# Patient Record
Sex: Female | Born: 1935 | Race: White | Hispanic: No | Smoking: Never smoker
Health system: Southern US, Community
[De-identification: ages and names within clinical notes are randomized; demographics above are authoritative.]

## PROBLEM LIST (undated history)

## (undated) DIAGNOSIS — E039 Hypothyroidism, unspecified: Secondary | ICD-10-CM

## (undated) HISTORY — PX: TONSILLECTOMY: SHX5217

## (undated) HISTORY — PX: REPLACEMENT TOTAL KNEE BILATERAL: SUR1225

## (undated) HISTORY — PX: APPENDECTOMY: SHX54

## (undated) HISTORY — DX: Hypothyroidism, unspecified: E03.9

## (undated) HISTORY — PX: CHOLECYSTECTOMY: SHX55

## (undated) HISTORY — PX: ABDOMINAL HYSTERECTOMY: SHX81

---

## 1999-02-14 ENCOUNTER — Encounter: Admission: RE | Admit: 1999-02-14 | Discharge: 1999-02-14 | Payer: Self-pay | Admitting: Obstetrics and Gynecology

## 1999-02-14 ENCOUNTER — Encounter: Payer: Self-pay | Admitting: Obstetrics and Gynecology

## 2000-02-16 ENCOUNTER — Encounter: Payer: Self-pay | Admitting: Obstetrics and Gynecology

## 2000-02-16 ENCOUNTER — Encounter: Admission: RE | Admit: 2000-02-16 | Discharge: 2000-02-16 | Payer: Self-pay | Admitting: Obstetrics and Gynecology

## 2001-02-18 ENCOUNTER — Encounter: Payer: Self-pay | Admitting: Obstetrics and Gynecology

## 2001-02-18 ENCOUNTER — Encounter: Admission: RE | Admit: 2001-02-18 | Discharge: 2001-02-18 | Payer: Self-pay | Admitting: Obstetrics and Gynecology

## 2002-03-31 ENCOUNTER — Encounter: Payer: Self-pay | Admitting: Obstetrics and Gynecology

## 2002-03-31 ENCOUNTER — Encounter: Admission: RE | Admit: 2002-03-31 | Discharge: 2002-03-31 | Payer: Self-pay | Admitting: Obstetrics and Gynecology

## 2003-04-20 ENCOUNTER — Encounter: Admission: RE | Admit: 2003-04-20 | Discharge: 2003-04-20 | Payer: Self-pay | Admitting: Obstetrics and Gynecology

## 2004-05-30 ENCOUNTER — Encounter: Admission: RE | Admit: 2004-05-30 | Discharge: 2004-05-30 | Payer: Self-pay | Admitting: Obstetrics and Gynecology

## 2005-07-10 ENCOUNTER — Encounter: Admission: RE | Admit: 2005-07-10 | Discharge: 2005-07-10 | Payer: Self-pay | Admitting: Obstetrics and Gynecology

## 2006-08-20 ENCOUNTER — Encounter: Admission: RE | Admit: 2006-08-20 | Discharge: 2006-08-20 | Payer: Self-pay | Admitting: Obstetrics and Gynecology

## 2008-08-14 ENCOUNTER — Inpatient Hospital Stay (HOSPITAL_COMMUNITY): Admission: RE | Admit: 2008-08-14 | Discharge: 2008-08-17 | Payer: Self-pay | Admitting: Orthopedic Surgery

## 2010-03-15 ENCOUNTER — Inpatient Hospital Stay (HOSPITAL_COMMUNITY)
Admission: RE | Admit: 2010-03-15 | Discharge: 2010-03-17 | Payer: Self-pay | Source: Home / Self Care | Attending: Orthopedic Surgery | Admitting: Orthopedic Surgery

## 2010-05-30 LAB — BASIC METABOLIC PANEL
BUN: 5 mg/dL — ABNORMAL LOW (ref 6–23)
Calcium: 9.6 mg/dL (ref 8.4–10.5)
Chloride: 106 mEq/L (ref 96–112)
Chloride: 107 mEq/L (ref 96–112)
Chloride: 107 mEq/L (ref 96–112)
Creatinine, Ser: 0.64 mg/dL (ref 0.4–1.2)
Creatinine, Ser: 0.81 mg/dL (ref 0.4–1.2)
GFR calc non Af Amer: >60 mL/min (ref >60)
Glucose, Bld: 110 mg/dL — ABNORMAL HIGH (ref 70–99)
Glucose, Bld: 126 mg/dL — ABNORMAL HIGH (ref 70–99)
Potassium: 4 mEq/L (ref 3.5–5.1)
Potassium: 4.3 mEq/L (ref 3.5–5.1)
Sodium: 140 mEq/L (ref 135–145)
Sodium: 143 mEq/L (ref 135–145)

## 2010-05-30 LAB — CBC
Hemoglobin: 10.1 g/dL — ABNORMAL LOW (ref 12.0–15.0)
Hemoglobin: 11.2 g/dL — ABNORMAL LOW (ref 12.0–15.0)
Hemoglobin: 14.5 g/dL (ref 12.0–15.0)
MCH: 30.1 pg (ref 26.0–34.0)
MCH: 31.1 pg (ref 26.0–34.0)
MCHC: 33.4 g/dL (ref 30.0–36.0)
MCHC: 33.7 g/dL (ref 30.0–36.0)
MCV: 92.3 fL (ref 78.0–100.0)
Platelets: 187 10*3/uL (ref 150–400)
RDW: 13.2 % (ref 11.5–15.5)
WBC: 7.3 10*3/uL (ref 4.0–10.5)

## 2010-05-30 LAB — URINALYSIS, ROUTINE W REFLEX MICROSCOPIC
Ketones, ur: NEGATIVE mg/dL
Nitrite: NEGATIVE
Protein, ur: NEGATIVE mg/dL
Specific Gravity, Urine: 1.023 (ref 1.005–1.030)
Urobilinogen, UA: 0.2 mg/dL (ref 0.0–1.0)

## 2010-05-30 LAB — TYPE AND SCREEN
ABO/RH(D): A POS
Antibody Screen: NEGATIVE

## 2010-05-30 LAB — SURGICAL PCR SCREEN: Staphylococcus aureus: NEGATIVE

## 2010-05-30 LAB — DIFFERENTIAL
Basophils Absolute: 0.1 10*3/uL (ref 0.0–0.1)
Eosinophils Relative: 3 % (ref 0–5)
Neutro Abs: 3.9 10*3/uL (ref 1.7–7.7)

## 2010-05-30 LAB — APTT: aPTT: 29 seconds (ref 24–37)

## 2010-05-30 LAB — PROTIME-INR: Prothrombin Time: 13 seconds (ref 11.6–15.2)

## 2010-06-09 NOTE — Discharge Summary (Signed)
NAMEKrislyn, [name]                  ACCOUNT NO.:  [id number]  MEDICAL RECORD NO.:  [id number]          PATIENT TYPE:  INP  LOCATION:  1612                         FACILITY:  [facility]  PHYSICIAN:  Madlyn Frankel. Charlann Boxer, M.D.  DATE OF BIRTH:  Dec 02, 1934  DATE OF ADMISSION:  03/15/2010 DATE OF DISCHARGE:  03/17/2010                              DISCHARGE SUMMARY   ADMITTING DIAGNOSIS:  Right knee osteoarthritis.  DISCHARGE DIAGNOSES: 1. Right knee osteoarthritis. 2. Hypothyroidism and hypercholesterolemia.  ADMITTING HISTORY:  [name] is a pleasant 76 year old female seen and evaluated in the office for some time with right knee osteoarthritis. At this point, we are failing to manage her knee pain conservatively and she was ready to proceed with knee replacement surgery.  Risks and benefits were discussed and surgery set up with postoperative course and plan.  HOSPITAL COURSE:  The patient was admitted for same-day surgery on March 15, 2010.  She underwent right total knee replacement that was uncomplicated.  Please see dictated operative note for full details of the procedure.  Postoperatively, she was transferred from the recovery room to the hospital floor where she remained for her 2-day hospital stay.  She was seen and evaluated by Physical Therapy.  She had her Foley removed and Hemovac removed.  Her hematocrit was 33.5 on postop day #1 with stable electrolytes.  She was seen and evaluated by Physical Therapy, weightbearing as tolerated, work on range of motion.  She was doing well enough on postop day #1 and she was feeling about going home but decided to go by day two.  She was on a regular diet.  DISCHARGE INSTRUCTIONS:  The patient was discharged to home on postop day #2 without any complicating features.  She will keep her wound dry until followup and will see Dr. Durene Todd at [facility] in 2 weeks for followup.  Any questions can be addressed at the  office. She will be on a regular diet.  DISCHARGE MEDICATIONS:  Her discharge medications at this point will include: 1. Colace 100 mg p.o. b.i.d. for constipation while on pain medicine. 2. Iron 325 mg p.o. 2-3 times a day for 2 weeks for perioperative     anemia. 3. Robaxin 500 mg p.o. q.6 hours for muscle spasm pain. 4. MiraLax 17 g p.o. daily for constipation while on pain medicines. 5. Xarelto 10 mg p.o. daily for 10 days followed by use of aspirin 325     mg daily for 4 weeks and then a regular 81 mg coated aspirin.  She had allergies to [facility] and OXYCODONE as well as DARVOCET and TRAMADOL.  For that reason, I only used Tylenol for pain control postop. She also used omeprazole 20 mg daily as needed for reflux, also Synthroid 100 mcg p.o. q.a.m., vitamin D 50,000 units monthly and Welchol 625 mg at bedtime  Questions were encouraged and reviewed at the time of discharge.     Madlyn Frankel Charlann Boxer, M.D.     MDO/MEDQ  D:  06/08/2010  T:  06/09/2010  Job:  [id number]  Electronically Signed by [name] M.D. on  06/09/2010 01:30:41 PM

## 2010-06-28 LAB — CBC
HCT: 28.2 % — ABNORMAL LOW (ref 36.0–46.0)
HCT: 32.5 % — ABNORMAL LOW (ref 36.0–46.0)
HCT: 44.6 % (ref 36.0–46.0)
Hemoglobin: 10.8 g/dL — ABNORMAL LOW (ref 12.0–15.0)
Hemoglobin: 15.4 g/dL — ABNORMAL HIGH (ref 12.0–15.0)
MCHC: 32.1 g/dL (ref 30.0–36.0)
MCHC: 33.4 g/dL (ref 30.0–36.0)
MCHC: 34.6 g/dL (ref 30.0–36.0)
MCV: 93.1 fL (ref 78.0–100.0)
Platelets: 169 10*3/uL (ref 150–400)
Platelets: 173 10*3/uL (ref 150–400)
Platelets: 188 10*3/uL (ref 150–400)
RBC: 3.28 MIL/uL — ABNORMAL LOW (ref 3.87–5.11)
RDW: 13.4 % (ref 11.5–15.5)
RDW: 13.4 % (ref 11.5–15.5)
RDW: 13.8 % (ref 11.5–15.5)
WBC: 6.3 10*3/uL (ref 4.0–10.5)
WBC: 6.5 10*3/uL (ref 4.0–10.5)

## 2010-06-28 LAB — BASIC METABOLIC PANEL
BUN: 10 mg/dL (ref 6–23)
BUN: 5 mg/dL — ABNORMAL LOW (ref 6–23)
BUN: 5 mg/dL — ABNORMAL LOW (ref 6–23)
CO2: 26 mEq/L (ref 19–32)
Calcium: 8.4 mg/dL (ref 8.4–10.5)
Calcium: 8.7 mg/dL (ref 8.4–10.5)
Calcium: 8.7 mg/dL (ref 8.4–10.5)
Calcium: 9.8 mg/dL (ref 8.4–10.5)
Chloride: 103 mEq/L (ref 96–112)
Chloride: 104 mEq/L (ref 96–112)
Creatinine, Ser: 0.6 mg/dL (ref 0.4–1.2)
Creatinine, Ser: 0.63 mg/dL (ref 0.4–1.2)
Creatinine, Ser: 0.68 mg/dL (ref 0.4–1.2)
Creatinine, Ser: 0.71 mg/dL (ref 0.4–1.2)
GFR calc Af Amer: >60 mL/min (ref >60)
GFR calc Af Amer: >60 mL/min (ref >60)
GFR calc Af Amer: >60 mL/min (ref >60)
GFR calc non Af Amer: >60 mL/min (ref >60)
GFR calc non Af Amer: >60 mL/min (ref >60)
Glucose, Bld: 103 mg/dL — ABNORMAL HIGH (ref 70–99)
Glucose, Bld: 122 mg/dL — ABNORMAL HIGH (ref 70–99)
Glucose, Bld: 97 mg/dL (ref 70–99)
Potassium: 4.6 mEq/L (ref 3.5–5.1)

## 2010-06-28 LAB — APTT
aPTT: 25 seconds (ref 24–37)
aPTT: 54 seconds — ABNORMAL HIGH (ref 24–37)

## 2010-06-28 LAB — PROTIME-INR
INR: 0.9 (ref 0.00–1.49)
INR: 1.4 (ref 0.00–1.49)
INR: 1.9 — ABNORMAL HIGH (ref 0.00–1.49)
Prothrombin Time: 17.4 seconds — ABNORMAL HIGH (ref 11.6–15.2)
Prothrombin Time: 17.5 seconds — ABNORMAL HIGH (ref 11.6–15.2)
Prothrombin Time: 22.9 seconds — ABNORMAL HIGH (ref 11.6–15.2)

## 2010-06-28 LAB — DIFFERENTIAL
Basophils Absolute: 0.1 10*3/uL (ref 0.0–0.1)
Eosinophils Relative: 3 % (ref 0–5)
Neutro Abs: 3.5 10*3/uL (ref 1.7–7.7)
Neutrophils Relative %: 56 % (ref 43–77)

## 2010-06-28 LAB — URINALYSIS, ROUTINE W REFLEX MICROSCOPIC
Bilirubin Urine: NEGATIVE
Nitrite: NEGATIVE
Protein, ur: NEGATIVE mg/dL
Urobilinogen, UA: 0.2 mg/dL (ref 0.0–1.0)

## 2010-06-28 LAB — TYPE AND SCREEN

## 2010-08-02 NOTE — Op Note (Signed)
NAMEManisha, Cancel Jimia                  ACCOUNT NO.:  [id number]   MEDICAL RECORD NO.:  [id number]          PATIENT TYPE:  INP   LOCATION:  0001                         FACILITY:  [facility]   PHYSICIAN:  Almedia Balls. Ranell Patrick, M.D. DATE OF BIRTH:  [birth year]   DATE OF PROCEDURE:  08/14/2008  DATE OF DISCHARGE:  08/17/2008                               OPERATIVE REPORT   PREOPERATIVE DIAGNOSIS:  Left knee end-stage osteoarthritis.   POSTOPERATIVE DIAGNOSIS:  Left knee end-stage osteoarthritis.   PROCEDURE PERFORMED:  Left total knee replacement using DePuy segmental  rotating platform prosthesis.   ATTENDING SURGEON:  Almedia Balls. Ranell Patrick, M.D.   ASSISTANT:  Donnie Coffin. Durwin Nora, P.A.   ANESTHESIA:  Spinal block anesthesia was used.   BLOOD LOSS:  Minimal.   TOURNIQUET TIME:  1 hour and 30 minutes at 300 mmHg.   INSTRUMENT COUNT:  Correct.   COMPLICATIONS:  None.   PERIOPERATIVE ANTIBIOTICS:  Given.   INDICATIONS:  The patient is a 76 year old female with a history of  worsening left knee pain secondary to end-stage arthritis.  The patient  is now desiring total knee replacement to restore function and eliminate  pain.  Informed consent was obtained.   DESCRIPTION OF PROCEDURE:  After an adequate level of local anesthesia  achieved the patient was positioned supine on the operating room table.  The left leg correctly identified.  The nonsterile tourniquet placed on  the proximal thigh.  The left leg sterilely prepped and draped in the  usual manner.  After elevation and exsanguination of the limb using the  Esmarch bandage we elevated the tourniquet to 300 mmHg.  A midline  incision was created with the knee in flexion.  Dissection down through  subcutaneous tissues.  A  median parapatellar arthrotomy was done.  The  patella was everted.  There was end-stage arthritis noted with bone on  bone wear, primarily in the medial compartment.  At this point, the  lateral patellofemoral ligaments  divided.  The distal femur entered  using a step-cut drill.  The 10 mm resected off the femur set on 5  degrees, left.  We then went ahead and sized the femur to a size 3,  anterior down and performed our four-in-one cut with the four-in-one  block.  The anterior, posterior and chamfer cuts were made.  The ACL,  PCL and meniscal tissue removed.  The tibia was subluxed anteriorly and  the tibia was cut using an external alignment jig, 4 mm off the affected  medial side and set on the minimal posterior slope, 90 degrees  perpendicular to the long axis of the tibia.  Once the tibia cut was  done we checked our flexion and extension gaps.  We were a little tight  on the medial side and performed a medial release, a single soft tissue  subperiosteal release.  This gave [place] the soft tissue balancing we  wanted.  We then resected extra posterior bone off the posterior femoral  condyles using a lamina spreader and a 3/4-inch curved osteotome.  Next,  we went ahead and  sized the tibia to size 3 and then performed a modular  drill and keel punch placing our tibial trial in place.  We then went  ahead and resected our box cut, using our box cut guide for the size 3  femur.  We then placed our trial 3 femur on the femur and reduced the  knee with a 10 mm insert and that seemed to give [place] good soft tissue  balancing and alignment.  We then went ahead and checked our patella.  It was 22 mm thick.  We resected down to 14 mm of thickness and then  placed a 32 patellar button on.  We had excellent patellar tracking  throughout a full range of motion with the no-hands technique.  We went  ahead and removed all trial components, thoroughly pulse irrigated the  knee and then plugged into the femur with available bone.  We then  cemented the components into place with DePuy SmartSet cement.  Once the  cement was allowed to harden we went and removed excess cement using 1/4-  inch curved osteotomes.  We began  trial with a 10 and the 12.5.  The  12.5 gave [place] a little bit better flexion and stability and was still  unable to get full extension.  We went ahead and selected a size 12.5  insert.  Replaced the trial insert with a real insert, reduced the knee.  Again, trialed through a full range of motion, good soft tissue  balancing, full extension and excellent patellar tracking.  Thorough  pulse irrigation, followed by closure of the parapatellar arthrotomy  using a #1 Vicryl suture figure-of-eight interrupted, followed by 2-0  Vicryl subcutaneous closure and 4-0 Monocryl for skin.  Steri-Strips  applied, followed by a sterile compressive bandage.  The patient  tolerated the surgery well.      Almedia Balls. Ranell Patrick, M.D.  Electronically Signed     SRN/MEDQ  D:  08/14/2008  T:  08/14/2008  Job:  [id number]

## 2010-08-05 NOTE — H&P (Signed)
NAMEAbimbola, [name]                  ACCOUNT NO.:  [id number]   MEDICAL RECORD NO.:  [id number]          PATIENT TYPE:  INP   LOCATION:  NA                           FACILITY:  [facility]   PHYSICIAN:  Almedia Balls. Ranell Patrick, M.D. DATE OF BIRTH:  April 07, 1934   DATE OF ADMISSION:  DATE OF DISCHARGE:                              HISTORY & PHYSICAL   CHIEF COMPLAINT:  Left knee pain.   HISTORY OF PRESENT ILLNESS:  The patient is a 76 year old female with  worsening left knee pain secondary to osteoarthritis.  The patient has  elected to have a left total knee arthroplasty by Dr. Malon Kindle.   PAST MEDICAL HISTORY:  1. Hyperlipidemia.  2. Hiatal hernia.  3. Hypothyroidism.   FAMILY MEDIAL HISTORY:  1. Coronary artery disease.  2. Hypertension.   SOCIAL HISTORY:  A patient of Dr. Marina Goodell.  Does not smoke or use alcohol  or illicit drugs.   ALLERGIES:  1. VICODIN.  2. CODEINE.  3. PERCOCET.  4. DARVOCET.   CURRENT MEDICATIONS:  1. Synthroid 112 mcg daily.  2. Claritin 10 mg daily.  3. WelChol 1 tablet daily.   REVIEW OF SYSTEMS:  Shows pain with range of motion and ambulation of  the left knee.   PHYSICAL EXAMINATION:  Pulse 80, respirations 16, blood pressure 140/68.  The patient is a healthy-appearing 76 year old female in no acute  distress, pleasant mood and affect, alert and oriented x3.  HEAD/NECK:  Shows cranial nerves II-XII are intact.  She has full range  of motion of the cervical spine without any tenderness.  No active  muscle spasm.  CHEST:  Active breath sounds bilaterally with no wheezes, rhonchi or  rales.  HEART:  Shows regular rate and regular rate and rhythm with no murmur.  ABDOMEN:  Nontender, nondistended with active bowel sounds.  EXTREMITIES:  Moderate tenderness and crepitus of the left knee,  especially the medial joint line.  NEUROVASCULAR:  She is intact distally with no edema or rashes.   X-rays show end-stage osteoarthritis of the left knee.   IMPRESSION:  End-stage osteoarthritis left knee.   PLAN OF ACTION:  She will have a left total knee arthroplasty by Dr.  Malon Kindle.      Thomas B. Durwin Nora, P.A.      Almedia Balls. Ranell Patrick, M.D.  Electronically Signed    TBD/MEDQ  D:  07/30/2008  T:  07/30/2008  Job:  [id number]

## 2010-08-05 NOTE — Discharge Summary (Signed)
[name], Kingsford Todd                  ACCOUNT NO.:  [id number]   MEDICAL RECORD NO.:  [id number]          PATIENT TYPE:  INP   LOCATION:  1533                         [facility]ACILITY:  [facility]   PHYSICIAN:  Almedia Balls. Ranell Patrick, M.D. DATE OF BIRTH:  1934-10-26   DATE OF ADMISSION:  08/14/2008  DATE OF DISCHARGE:  08/17/2008                               DISCHARGE SUMMARY   BRIEF HISTORY:  The patient is a 76 year old female with worsening left  knee pain secondary to osteoarthritis.  The patient has elected for  surgical management of pain relief.   PROCEDURES:  The patient had a left total knee arthroplasty by Dr.  Malon Kindle on Aug 14 998.  Assistant was [employer].  No  complications in hospital.  Minimal blood loss.   HOSPITAL COURSE:  The patient was admitted on Aug 14, 2008 with the  above-stated procedure which she tolerated well.  After adequate time in  post anesthesia care unit, she was transferred up to [place].  Postop  day #1, the patient complained about some minimal pain to that left  knee.  Was able to work with physical therapy over the next several  days.  Her labs were within stable limits.  She was afebrile.  After  several days of physical therapy she was discharged home with home  health physical therapy, home health nursing with no signs of  cellulitis, erythema or infection to her wound.   DISCHARGE PLAN:  The patient will be discharged home on Aug 17, 2008.   CONDITION:  Her condition is stable.   DIET:  Her diet is regular.   The patient has allergies to VICODIN, CODEINE, PERCOCET, and DARVOCET.   DISCHARGE MEDICATIONS:  1. Synthroid 112 mcg daily.  2. Claritin 10 mg daily.  3. Welchol 1 tablet daily.  4. Robaxin 500 mg p.o. q.6 hours p.r.n. spasm.  5. Ultram 50 mg 1-2 tabs q.4-6 hours p.r.n. pain.  6. Coumadin per pharmacy protocol.   FOLLOW UP:  The patient will follow back up with Dr. Malon Kindle, and  she is weightbearing as  tolerated.      Thomas B. Durwin Nora, P.A.      Almedia Balls. Ranell Patrick, M.D.  Electronically Signed    TBD/MEDQ  D:  09/10/2008  T:  09/10/2008  Job:  [id number]

## 2011-05-17 DIAGNOSIS — E785 Hyperlipidemia, unspecified: Secondary | ICD-10-CM | POA: Diagnosis not present

## 2011-05-17 DIAGNOSIS — D508 Other iron deficiency anemias: Secondary | ICD-10-CM | POA: Diagnosis not present

## 2011-05-17 DIAGNOSIS — E039 Hypothyroidism, unspecified: Secondary | ICD-10-CM | POA: Diagnosis not present

## 2011-05-17 DIAGNOSIS — E559 Vitamin D deficiency, unspecified: Secondary | ICD-10-CM | POA: Diagnosis not present

## 2011-05-17 DIAGNOSIS — Z0000 Encounter for general adult medical examination without abnormal findings: Secondary | ICD-10-CM | POA: Diagnosis not present

## 2011-07-13 DIAGNOSIS — Z1231 Encounter for screening mammogram for malignant neoplasm of breast: Secondary | ICD-10-CM | POA: Diagnosis not present

## 2011-08-23 DIAGNOSIS — E785 Hyperlipidemia, unspecified: Secondary | ICD-10-CM | POA: Diagnosis not present

## 2011-08-23 DIAGNOSIS — E559 Vitamin D deficiency, unspecified: Secondary | ICD-10-CM | POA: Diagnosis not present

## 2011-08-23 DIAGNOSIS — E039 Hypothyroidism, unspecified: Secondary | ICD-10-CM | POA: Diagnosis not present

## 2011-08-23 DIAGNOSIS — J309 Allergic rhinitis, unspecified: Secondary | ICD-10-CM | POA: Diagnosis not present

## 2011-08-23 DIAGNOSIS — D509 Iron deficiency anemia, unspecified: Secondary | ICD-10-CM | POA: Diagnosis not present

## 2011-11-30 DIAGNOSIS — T887XXA Unspecified adverse effect of drug or medicament, initial encounter: Secondary | ICD-10-CM | POA: Diagnosis not present

## 2011-11-30 DIAGNOSIS — I10 Essential (primary) hypertension: Secondary | ICD-10-CM | POA: Diagnosis not present

## 2011-11-30 DIAGNOSIS — E782 Mixed hyperlipidemia: Secondary | ICD-10-CM | POA: Diagnosis not present

## 2011-11-30 DIAGNOSIS — M25569 Pain in unspecified knee: Secondary | ICD-10-CM | POA: Diagnosis not present

## 2011-11-30 DIAGNOSIS — Z79899 Other long term (current) drug therapy: Secondary | ICD-10-CM | POA: Diagnosis not present

## 2011-11-30 DIAGNOSIS — E785 Hyperlipidemia, unspecified: Secondary | ICD-10-CM | POA: Diagnosis not present

## 2011-11-30 DIAGNOSIS — E559 Vitamin D deficiency, unspecified: Secondary | ICD-10-CM | POA: Diagnosis not present

## 2011-12-13 DIAGNOSIS — S8000XA Contusion of unspecified knee, initial encounter: Secondary | ICD-10-CM | POA: Diagnosis not present

## 2011-12-13 DIAGNOSIS — R262 Difficulty in walking, not elsewhere classified: Secondary | ICD-10-CM | POA: Diagnosis not present

## 2011-12-15 DIAGNOSIS — R262 Difficulty in walking, not elsewhere classified: Secondary | ICD-10-CM | POA: Diagnosis not present

## 2011-12-15 DIAGNOSIS — S8000XA Contusion of unspecified knee, initial encounter: Secondary | ICD-10-CM | POA: Diagnosis not present

## 2011-12-18 DIAGNOSIS — S8000XA Contusion of unspecified knee, initial encounter: Secondary | ICD-10-CM | POA: Diagnosis not present

## 2011-12-18 DIAGNOSIS — R262 Difficulty in walking, not elsewhere classified: Secondary | ICD-10-CM | POA: Diagnosis not present

## 2011-12-20 DIAGNOSIS — R82998 Other abnormal findings in urine: Secondary | ICD-10-CM | POA: Diagnosis not present

## 2011-12-20 DIAGNOSIS — S8000XA Contusion of unspecified knee, initial encounter: Secondary | ICD-10-CM | POA: Diagnosis not present

## 2011-12-22 DIAGNOSIS — S8000XA Contusion of unspecified knee, initial encounter: Secondary | ICD-10-CM | POA: Diagnosis not present

## 2011-12-22 DIAGNOSIS — R82998 Other abnormal findings in urine: Secondary | ICD-10-CM | POA: Diagnosis not present

## 2011-12-25 DIAGNOSIS — R82998 Other abnormal findings in urine: Secondary | ICD-10-CM | POA: Diagnosis not present

## 2011-12-25 DIAGNOSIS — S8000XA Contusion of unspecified knee, initial encounter: Secondary | ICD-10-CM | POA: Diagnosis not present

## 2011-12-29 DIAGNOSIS — R82998 Other abnormal findings in urine: Secondary | ICD-10-CM | POA: Diagnosis not present

## 2011-12-29 DIAGNOSIS — S8000XA Contusion of unspecified knee, initial encounter: Secondary | ICD-10-CM | POA: Diagnosis not present

## 2012-01-01 DIAGNOSIS — S8000XA Contusion of unspecified knee, initial encounter: Secondary | ICD-10-CM | POA: Diagnosis not present

## 2012-01-01 DIAGNOSIS — R82998 Other abnormal findings in urine: Secondary | ICD-10-CM | POA: Diagnosis not present

## 2012-01-05 DIAGNOSIS — S8000XA Contusion of unspecified knee, initial encounter: Secondary | ICD-10-CM | POA: Diagnosis not present

## 2012-01-05 DIAGNOSIS — R82998 Other abnormal findings in urine: Secondary | ICD-10-CM | POA: Diagnosis not present

## 2012-01-08 DIAGNOSIS — S8000XA Contusion of unspecified knee, initial encounter: Secondary | ICD-10-CM | POA: Diagnosis not present

## 2012-01-08 DIAGNOSIS — R82998 Other abnormal findings in urine: Secondary | ICD-10-CM | POA: Diagnosis not present

## 2012-01-12 DIAGNOSIS — S8000XA Contusion of unspecified knee, initial encounter: Secondary | ICD-10-CM | POA: Diagnosis not present

## 2012-01-12 DIAGNOSIS — R82998 Other abnormal findings in urine: Secondary | ICD-10-CM | POA: Diagnosis not present

## 2012-01-15 DIAGNOSIS — R82998 Other abnormal findings in urine: Secondary | ICD-10-CM | POA: Diagnosis not present

## 2012-01-15 DIAGNOSIS — S8000XA Contusion of unspecified knee, initial encounter: Secondary | ICD-10-CM | POA: Diagnosis not present

## 2012-01-17 DIAGNOSIS — S8000XA Contusion of unspecified knee, initial encounter: Secondary | ICD-10-CM | POA: Diagnosis not present

## 2012-01-17 DIAGNOSIS — R82998 Other abnormal findings in urine: Secondary | ICD-10-CM | POA: Diagnosis not present

## 2012-01-22 DIAGNOSIS — J019 Acute sinusitis, unspecified: Secondary | ICD-10-CM | POA: Diagnosis not present

## 2012-01-22 DIAGNOSIS — H10029 Other mucopurulent conjunctivitis, unspecified eye: Secondary | ICD-10-CM | POA: Diagnosis not present

## 2012-01-26 DIAGNOSIS — H18839 Recurrent erosion of cornea, unspecified eye: Secondary | ICD-10-CM | POA: Diagnosis not present

## 2012-01-31 DIAGNOSIS — S8000XA Contusion of unspecified knee, initial encounter: Secondary | ICD-10-CM | POA: Diagnosis not present

## 2012-01-31 DIAGNOSIS — R82998 Other abnormal findings in urine: Secondary | ICD-10-CM | POA: Diagnosis not present

## 2012-02-28 DIAGNOSIS — M169 Osteoarthritis of hip, unspecified: Secondary | ICD-10-CM | POA: Diagnosis not present

## 2012-02-28 DIAGNOSIS — M25569 Pain in unspecified knee: Secondary | ICD-10-CM | POA: Diagnosis not present

## 2012-03-11 DIAGNOSIS — E785 Hyperlipidemia, unspecified: Secondary | ICD-10-CM | POA: Diagnosis not present

## 2012-03-11 DIAGNOSIS — E039 Hypothyroidism, unspecified: Secondary | ICD-10-CM | POA: Diagnosis not present

## 2012-03-11 DIAGNOSIS — F341 Dysthymic disorder: Secondary | ICD-10-CM | POA: Diagnosis not present

## 2012-05-06 DIAGNOSIS — H532 Diplopia: Secondary | ICD-10-CM | POA: Diagnosis not present

## 2012-05-08 DIAGNOSIS — I6340 Cerebral infarction due to embolism of unspecified cerebral artery: Secondary | ICD-10-CM | POA: Diagnosis not present

## 2012-05-10 DIAGNOSIS — I658 Occlusion and stenosis of other precerebral arteries: Secondary | ICD-10-CM | POA: Diagnosis not present

## 2012-05-10 DIAGNOSIS — I6529 Occlusion and stenosis of unspecified carotid artery: Secondary | ICD-10-CM | POA: Diagnosis not present

## 2012-05-10 DIAGNOSIS — I359 Nonrheumatic aortic valve disorder, unspecified: Secondary | ICD-10-CM | POA: Diagnosis not present

## 2012-05-10 DIAGNOSIS — I369 Nonrheumatic tricuspid valve disorder, unspecified: Secondary | ICD-10-CM | POA: Diagnosis not present

## 2012-05-10 DIAGNOSIS — I6350 Cerebral infarction due to unspecified occlusion or stenosis of unspecified cerebral artery: Secondary | ICD-10-CM | POA: Diagnosis not present

## 2012-05-10 DIAGNOSIS — I079 Rheumatic tricuspid valve disease, unspecified: Secondary | ICD-10-CM | POA: Diagnosis not present

## 2012-06-13 DIAGNOSIS — I6340 Cerebral infarction due to embolism of unspecified cerebral artery: Secondary | ICD-10-CM | POA: Diagnosis not present

## 2012-06-13 DIAGNOSIS — E785 Hyperlipidemia, unspecified: Secondary | ICD-10-CM | POA: Diagnosis not present

## 2012-06-13 DIAGNOSIS — H532 Diplopia: Secondary | ICD-10-CM | POA: Diagnosis not present

## 2012-06-13 DIAGNOSIS — I10 Essential (primary) hypertension: Secondary | ICD-10-CM | POA: Diagnosis not present

## 2012-06-13 DIAGNOSIS — E039 Hypothyroidism, unspecified: Secondary | ICD-10-CM | POA: Diagnosis not present

## 2012-07-17 DIAGNOSIS — H2510 Age-related nuclear cataract, unspecified eye: Secondary | ICD-10-CM | POA: Diagnosis not present

## 2012-08-07 DIAGNOSIS — Z1231 Encounter for screening mammogram for malignant neoplasm of breast: Secondary | ICD-10-CM | POA: Diagnosis not present

## 2012-10-03 DIAGNOSIS — I6340 Cerebral infarction due to embolism of unspecified cerebral artery: Secondary | ICD-10-CM | POA: Diagnosis not present

## 2012-10-07 DIAGNOSIS — I6340 Cerebral infarction due to embolism of unspecified cerebral artery: Secondary | ICD-10-CM | POA: Diagnosis not present

## 2012-10-08 DIAGNOSIS — I6340 Cerebral infarction due to embolism of unspecified cerebral artery: Secondary | ICD-10-CM | POA: Diagnosis not present

## 2012-10-16 DIAGNOSIS — E039 Hypothyroidism, unspecified: Secondary | ICD-10-CM | POA: Diagnosis not present

## 2012-10-16 DIAGNOSIS — I10 Essential (primary) hypertension: Secondary | ICD-10-CM | POA: Diagnosis not present

## 2012-10-16 DIAGNOSIS — E785 Hyperlipidemia, unspecified: Secondary | ICD-10-CM | POA: Diagnosis not present

## 2012-10-16 DIAGNOSIS — I6340 Cerebral infarction due to embolism of unspecified cerebral artery: Secondary | ICD-10-CM | POA: Diagnosis not present

## 2012-10-17 DIAGNOSIS — E785 Hyperlipidemia, unspecified: Secondary | ICD-10-CM | POA: Diagnosis not present

## 2012-10-17 DIAGNOSIS — E039 Hypothyroidism, unspecified: Secondary | ICD-10-CM | POA: Diagnosis not present

## 2012-10-17 DIAGNOSIS — I6340 Cerebral infarction due to embolism of unspecified cerebral artery: Secondary | ICD-10-CM | POA: Diagnosis not present

## 2012-10-17 DIAGNOSIS — I10 Essential (primary) hypertension: Secondary | ICD-10-CM | POA: Diagnosis not present

## 2013-01-06 DIAGNOSIS — S43499A Other sprain of unspecified shoulder joint, initial encounter: Secondary | ICD-10-CM | POA: Diagnosis not present

## 2013-01-24 DIAGNOSIS — E785 Hyperlipidemia, unspecified: Secondary | ICD-10-CM | POA: Diagnosis not present

## 2013-01-24 DIAGNOSIS — M169 Osteoarthritis of hip, unspecified: Secondary | ICD-10-CM | POA: Diagnosis not present

## 2013-01-24 DIAGNOSIS — E039 Hypothyroidism, unspecified: Secondary | ICD-10-CM | POA: Diagnosis not present

## 2013-01-24 DIAGNOSIS — I10 Essential (primary) hypertension: Secondary | ICD-10-CM | POA: Diagnosis not present

## 2013-01-24 DIAGNOSIS — Z23 Encounter for immunization: Secondary | ICD-10-CM | POA: Diagnosis not present

## 2013-02-25 DIAGNOSIS — Z23 Encounter for immunization: Secondary | ICD-10-CM | POA: Diagnosis not present

## 2013-03-10 DIAGNOSIS — J019 Acute sinusitis, unspecified: Secondary | ICD-10-CM | POA: Diagnosis not present

## 2013-04-09 DIAGNOSIS — N390 Urinary tract infection, site not specified: Secondary | ICD-10-CM | POA: Diagnosis not present

## 2013-05-29 DIAGNOSIS — E785 Hyperlipidemia, unspecified: Secondary | ICD-10-CM | POA: Diagnosis not present

## 2013-05-29 DIAGNOSIS — E039 Hypothyroidism, unspecified: Secondary | ICD-10-CM | POA: Diagnosis not present

## 2013-05-29 DIAGNOSIS — I10 Essential (primary) hypertension: Secondary | ICD-10-CM | POA: Diagnosis not present

## 2013-05-29 DIAGNOSIS — E782 Mixed hyperlipidemia: Secondary | ICD-10-CM | POA: Diagnosis not present

## 2013-06-03 DIAGNOSIS — Z1211 Encounter for screening for malignant neoplasm of colon: Secondary | ICD-10-CM | POA: Diagnosis not present

## 2013-08-13 DIAGNOSIS — Z1231 Encounter for screening mammogram for malignant neoplasm of breast: Secondary | ICD-10-CM | POA: Diagnosis not present

## 2013-10-02 DIAGNOSIS — I6340 Cerebral infarction due to embolism of unspecified cerebral artery: Secondary | ICD-10-CM | POA: Diagnosis not present

## 2013-10-02 DIAGNOSIS — IMO0002 Reserved for concepts with insufficient information to code with codable children: Secondary | ICD-10-CM | POA: Diagnosis not present

## 2013-10-02 DIAGNOSIS — E785 Hyperlipidemia, unspecified: Secondary | ICD-10-CM | POA: Diagnosis not present

## 2013-10-02 DIAGNOSIS — Z0000 Encounter for general adult medical examination without abnormal findings: Secondary | ICD-10-CM | POA: Diagnosis not present

## 2013-10-02 DIAGNOSIS — I10 Essential (primary) hypertension: Secondary | ICD-10-CM | POA: Diagnosis not present

## 2013-10-27 DIAGNOSIS — M899 Disorder of bone, unspecified: Secondary | ICD-10-CM | POA: Diagnosis not present

## 2013-10-27 DIAGNOSIS — Z13820 Encounter for screening for osteoporosis: Secondary | ICD-10-CM | POA: Diagnosis not present

## 2013-10-27 DIAGNOSIS — M949 Disorder of cartilage, unspecified: Secondary | ICD-10-CM | POA: Diagnosis not present

## 2013-12-21 DIAGNOSIS — H2513 Age-related nuclear cataract, bilateral: Secondary | ICD-10-CM | POA: Diagnosis not present

## 2014-02-02 DIAGNOSIS — Z23 Encounter for immunization: Secondary | ICD-10-CM | POA: Diagnosis not present

## 2014-04-06 DIAGNOSIS — E559 Vitamin D deficiency, unspecified: Secondary | ICD-10-CM | POA: Diagnosis not present

## 2014-04-06 DIAGNOSIS — I10 Essential (primary) hypertension: Secondary | ICD-10-CM | POA: Diagnosis not present

## 2014-04-06 DIAGNOSIS — E039 Hypothyroidism, unspecified: Secondary | ICD-10-CM | POA: Diagnosis not present

## 2014-04-06 DIAGNOSIS — E785 Hyperlipidemia, unspecified: Secondary | ICD-10-CM | POA: Diagnosis not present

## 2014-04-07 DIAGNOSIS — E039 Hypothyroidism, unspecified: Secondary | ICD-10-CM | POA: Diagnosis not present

## 2014-04-07 DIAGNOSIS — I6340 Cerebral infarction due to embolism of unspecified cerebral artery: Secondary | ICD-10-CM | POA: Diagnosis not present

## 2014-04-07 DIAGNOSIS — I10 Essential (primary) hypertension: Secondary | ICD-10-CM | POA: Diagnosis not present

## 2014-04-07 DIAGNOSIS — E785 Hyperlipidemia, unspecified: Secondary | ICD-10-CM | POA: Diagnosis not present

## 2014-04-07 DIAGNOSIS — Z6824 Body mass index (BMI) 24.0-24.9, adult: Secondary | ICD-10-CM | POA: Diagnosis not present

## 2014-06-30 DIAGNOSIS — M79672 Pain in left foot: Secondary | ICD-10-CM | POA: Diagnosis not present

## 2014-07-15 DIAGNOSIS — M7732 Calcaneal spur, left foot: Secondary | ICD-10-CM | POA: Diagnosis not present

## 2014-07-15 DIAGNOSIS — M79672 Pain in left foot: Secondary | ICD-10-CM | POA: Diagnosis not present

## 2014-07-15 DIAGNOSIS — M722 Plantar fascial fibromatosis: Secondary | ICD-10-CM | POA: Diagnosis not present

## 2014-07-20 DIAGNOSIS — M79672 Pain in left foot: Secondary | ICD-10-CM | POA: Diagnosis not present

## 2014-07-20 DIAGNOSIS — M7732 Calcaneal spur, left foot: Secondary | ICD-10-CM | POA: Diagnosis not present

## 2014-07-22 DIAGNOSIS — M79672 Pain in left foot: Secondary | ICD-10-CM | POA: Diagnosis not present

## 2014-07-22 DIAGNOSIS — M7732 Calcaneal spur, left foot: Secondary | ICD-10-CM | POA: Diagnosis not present

## 2014-07-27 DIAGNOSIS — M79672 Pain in left foot: Secondary | ICD-10-CM | POA: Diagnosis not present

## 2014-07-27 DIAGNOSIS — M7732 Calcaneal spur, left foot: Secondary | ICD-10-CM | POA: Diagnosis not present

## 2014-07-30 DIAGNOSIS — M7732 Calcaneal spur, left foot: Secondary | ICD-10-CM | POA: Diagnosis not present

## 2014-07-30 DIAGNOSIS — M79672 Pain in left foot: Secondary | ICD-10-CM | POA: Diagnosis not present

## 2014-08-06 DIAGNOSIS — M7732 Calcaneal spur, left foot: Secondary | ICD-10-CM | POA: Diagnosis not present

## 2014-08-06 DIAGNOSIS — M79672 Pain in left foot: Secondary | ICD-10-CM | POA: Diagnosis not present

## 2014-08-07 DIAGNOSIS — M7732 Calcaneal spur, left foot: Secondary | ICD-10-CM | POA: Diagnosis not present

## 2014-08-07 DIAGNOSIS — M79672 Pain in left foot: Secondary | ICD-10-CM | POA: Diagnosis not present

## 2014-08-10 DIAGNOSIS — M79672 Pain in left foot: Secondary | ICD-10-CM | POA: Diagnosis not present

## 2014-08-10 DIAGNOSIS — M7732 Calcaneal spur, left foot: Secondary | ICD-10-CM | POA: Diagnosis not present

## 2014-08-12 DIAGNOSIS — M7732 Calcaneal spur, left foot: Secondary | ICD-10-CM | POA: Diagnosis not present

## 2014-08-12 DIAGNOSIS — M79672 Pain in left foot: Secondary | ICD-10-CM | POA: Diagnosis not present

## 2014-08-20 DIAGNOSIS — Z1231 Encounter for screening mammogram for malignant neoplasm of breast: Secondary | ICD-10-CM | POA: Diagnosis not present

## 2014-09-10 ENCOUNTER — Other Ambulatory Visit (INDEPENDENT_AMBULATORY_CARE_PROVIDER_SITE_OTHER): Payer: Medicare Other

## 2014-09-10 ENCOUNTER — Ambulatory Visit (INDEPENDENT_AMBULATORY_CARE_PROVIDER_SITE_OTHER): Payer: Medicare Other | Admitting: Family Medicine

## 2014-09-10 ENCOUNTER — Encounter: Payer: Self-pay | Admitting: Family Medicine

## 2014-09-10 VITALS — BP 136/72 | HR 70

## 2014-09-10 DIAGNOSIS — M79672 Pain in left foot: Secondary | ICD-10-CM

## 2014-09-10 DIAGNOSIS — M25572 Pain in left ankle and joints of left foot: Secondary | ICD-10-CM

## 2014-09-10 MED ORDER — VITAMIN D (ERGOCALCIFEROL) 1.25 MG (50000 UNIT) PO CAPS: 50000.0000 [IU] | ORAL_CAPSULE | ORAL | Status: DC

## 2014-09-10 MED ORDER — ALENDRONATE SODIUM 70 MG PO TABS: 70.0000 mg | ORAL_TABLET | ORAL | Status: DC

## 2014-09-10 NOTE — Progress Notes (Signed)
[name] [place], [place] [address] Phone: [phone] Subjective:    CC: Left foot pain  PIR:JJOACZYSAY [name] is a 80 y.o. female coming in with complaint of left foot pain. Patient has had this pain for approximately 3 months. Patient originally rolled her foot on the ankle. Patient's another provider and was diagnosed with an ankle sprain. Patient was in a boot for 3-6 weeks. Patient and did formal physical therapy which she thinks and forcefully seem to hurt the foot more. Patient continues to have swelling and continues to have pain on the dorsal aspect of the foot. Patient states that she can move the ankle fine. Is able to ambulate but has pain and soreness with and without activity. States that there is even a dull aching pain at night that can keep her up. Patient rates the severity of pain a 6 out of 10. States that sometimes it feels that her foot is getting numb as well as. Denies any calf pain associated with it.  No past medical history on file. No past surgical history on file. History  Substance Use Topics  . Smoking status: Never Smoker   . Smokeless tobacco: Not on file  . Alcohol Use: Not on file   Not on File No family history on file.      Past medical history, social, surgical and family history all reviewed in electronic medical record.   Review of Systems: No headache, visual changes, nausea, vomiting, diarrhea, constipation, dizziness, abdominal pain, skin rash, fevers, chills, night sweats, weight loss, swollen lymph nodes, body aches, joint swelling, muscle aches, chest pain, shortness of breath, mood changes.   Objective Blood pressure 136/72, pulse 70, SpO2 98 %.  General: No apparent distress alert and oriented x3 mood and affect normal, dressed appropriately.  HEENT: Pupils equal, extraocular movements intact  Respiratory: Patient's speak in full sentences and does not appear short of breath    Cardiovascular: No lower extremity edema, non tender, no erythema  Skin: Warm dry intact with no signs of infection or rash on extremities or on axial skeleton.  Abdomen: Soft nontender  Neuro: Cranial nerves II through XII are intact, neurovascularly intact in all extremities with 2+ DTRs and 2+ pulses.  Lymph: No lymphadenopathy of posterior or anterior cervical chain or axillae bilaterally.  Gait normal with good balance and coordination.  MSK:  Non tender with full range of motion and good stability and symmetric strength and tone of shoulders, elbows, wrist, hip, knees bilaterally.   Ankle: Left Swelling noted over the dorsal aspect of the foot Range of motion is full in all directions. Strength is 5/5 in all directions. Stable lateral and medial ligaments; squeeze test and kleiger test unremarkable; Talar dome laterally tender No pain at base of 5th MT; No tenderness over cuboid; No tenderness over N spot or navicular prominence No tenderness on posterior aspects of lateral and medial malleolus She will is tender to palpation over the dorsal aspect of the second and third toes near the Lisfranc joint. Positive compression squeeze test of the foot No sign of peroneal tendon subluxations or tenderness to palpation Negative tarsal tunnel tinel's Able to walk 4 steps. Contralateral ankle unremarkable with no swelling  MSK US performed of: Left This study was ordered, performed, and interpreted by Charlann Boxer D.O.  Foot/Ankle:   All structures visualized.   Talar dome with hypoechoic changes with what appears to be a very small cortical defect  noted. Increasing Doppler flow Ankle mortise with minimal effusion Peroneus longus and brevis tendons unremarkable on long and transverse views without sheath effusions. Posterior tibialis, flexor hallucis longus, and flexor digitorum longus tendons unremarkable on long and transverse views without sheath effusions. Achilles tendon visualized  along length of tendon and unremarkable on long and transverse views without sheath effusion. Anterior Talofibular Ligament and Calcaneofibular Ligaments unremarkable and intact. Mild degenerative changes Patient though does have what also appears to be a Lisfranc injury with a positive Fleck sign. Hypoechoic changes still surrounding the area as well as significant increase in Doppler flow.  Power doppler signal normal.  IMPRESSION:  Left talar dome injury with questionable Lisfranc injury.     Impression and Recommendations:     This case required medical decision making of moderate complexity.

## 2014-09-10 NOTE — Assessment & Plan Note (Signed)
With patient making very minimal improvement over the course last months of treatment including physical therapy for an ankle injury and continued dorsal swelling of the foot I'm concerned the patient has more of a Lisfranc injury which we seen on ultrasound as well as potentially a talar dome injury. Patient is not having any instability at this time and I do think that she can respond well to home exercises. We discussed patient being on weightbearing which is not going to occur. Patient is going to be put in a Cam Walker for the next 2 weeks. We discussed vitamin D supplementation as well as Fosamax. Patient will do this for a total of 8 weeks. Hopefully at this makes significant improvement. Patient warned of potential side effects. We discussed monitoring for symptoms and if any worsening advance imaging may be warranted.  Patient has any significant difficulty with ambulation or if worsening pain patient is to come back immediately.

## 2014-09-10 NOTE — Progress Notes (Signed)
Pre visit review using our clinic review tool, if applicable. No additional management support is needed unless otherwise documented below in the visit note. 

## 2014-09-10 NOTE — Patient Instructions (Signed)
Good to see you Ice 20 minutes 2 times daily. Usually after activity and before bed. Ice the ankle Vitamin D 50000 weekly Fosamax weekly as well but stay upright for 30 minutes after taking Move the ankle Wear rigid shoe in the house and boot out of the house for next 2 weeks See me again in 2-3 weeks or call sooner if not better or worsening.

## 2014-09-24 ENCOUNTER — Ambulatory Visit (INDEPENDENT_AMBULATORY_CARE_PROVIDER_SITE_OTHER): Payer: Medicare Other | Admitting: Family Medicine

## 2014-09-24 ENCOUNTER — Encounter: Payer: Self-pay | Admitting: Family Medicine

## 2014-09-24 ENCOUNTER — Other Ambulatory Visit (INDEPENDENT_AMBULATORY_CARE_PROVIDER_SITE_OTHER): Payer: Medicare Other

## 2014-09-24 VITALS — BP 132/74 | HR 69

## 2014-09-24 DIAGNOSIS — M129 Arthropathy, unspecified: Secondary | ICD-10-CM

## 2014-09-24 DIAGNOSIS — M79672 Pain in left foot: Secondary | ICD-10-CM | POA: Diagnosis not present

## 2014-09-24 DIAGNOSIS — M19079 Primary osteoarthritis, unspecified ankle and foot: Secondary | ICD-10-CM

## 2014-09-24 NOTE — Progress Notes (Signed)
[name] [place], [place] [address] Phone: [phone] Subjective:    CC: Left foot pain  WSF:KCLEXNTZGY [name] is a 80 y.o. female coming in with complaint of left foot pain. Patient was found to have what appeared to be more of a Lisfranc injury as well as midfoot arthritis. Patient was put in a Cam Walker to try to make this feel better. Patient was to do more icing as well as patient was given vitamin D and Fosamax. Patient states that the Fosamax made her feel sick to her stomach she she had to discontinue it. Patient continued the vitamin D supplementation. Patient states that she has not made any significant improvement. Patient states that if she does not wear the Cam Walker she has pain. Patient has not been wearing the Cam Walker at home. Patient states though that the swelling may be better. States that the severe pain may be better as well. Concerned because she has not made any significant improvement.  No past medical history on file. No past surgical history on file. History  Substance Use Topics  . Smoking status: Never Smoker   . Smokeless tobacco: Not on file  . Alcohol Use: Not on file   Not on File No family history on file.      Past medical history, social, surgical and family history all reviewed in electronic medical record.   Review of Systems: No headache, visual changes, nausea, vomiting, diarrhea, constipation, dizziness, abdominal pain, skin rash, fevers, chills, night sweats, weight loss, swollen lymph nodes, body aches, joint swelling, muscle aches, chest pain, shortness of breath, mood changes.   Objective Blood pressure 132/74, pulse 69, SpO2 95 %.  General: No apparent distress alert and oriented x3 mood and affect normal, dressed appropriately.  HEENT: Pupils equal, extraocular movements intact  Respiratory: Patient's speak in full sentences and does not appear short of breath  Cardiovascular: No  lower extremity edema, non tender, no erythema  Skin: Warm dry intact with no signs of infection or rash on extremities or on axial skeleton.  Abdomen: Soft nontender  Neuro: Cranial nerves II through XII are intact, neurovascularly intact in all extremities with 2+ DTRs and 2+ pulses.  Lymph: No lymphadenopathy of posterior or anterior cervical chain or axillae bilaterally.  Gait normal with good balance and coordination.  MSK:  Non tender with full range of motion and good stability and symmetric strength and tone of shoulders, elbows, wrist, hip, knees bilaterally.   Ankle: Left No swelling noted which is an improvement. Range of motion is full in all directions. Strength is 5/5 in all directions. Stable lateral and medial ligaments; squeeze test and kleiger test unremarkable; Less tender over the talar dome. No pain at base of 5th MT; No tenderness over cuboid; No tenderness over N spot or navicular prominence No tenderness on posterior aspects of lateral and medial malleolus She will is tender to palpation over the dorsal aspect of the third and fourth toes near the Lisfranc joint Positive compression squeeze test of the foot No sign of peroneal tendon subluxations or tenderness to palpation Negative tarsal tunnel tinel's Able to walk 4 steps. Contralateral ankle unremarkable with no swelling  MSK US performed of: Left This study was ordered, performed, and interpreted by Charlann Boxer D.O.  Foot/Ankle:   All structures visualized.   Talar dome shows still osteophytic changes but no true hypoechoic changes and increasing Doppler flow that was seen previously. Peroneus  longus and brevis tendons unremarkable on long and transverse views without sheath effusions. Posterior tibialis, flexor hallucis longus, and flexor digitorum longus tendons unremarkable on long and transverse views without sheath effusions. Achilles tendon visualized along length of tendon and unremarkable on long and  transverse views without sheath effusion. Anterior Talofibular Ligament and Calcaneofibular Ligaments unremarkable and intact. Mild degenerative changes Patient mid foot significant less hypoechoic changes but capsulitis noted at 4th/5th MTP joints.   Power doppler signal normal.  IMPRESSION:  Left talar dome injury with questionable Lisfranc injury.   Procedure: Real-time Ultrasound Guided Injection of midfoot left Device: GE Logiq E  Ultrasound guided injection is preferred based studies that show increased duration, increased effect, greater accuracy, decreased procedural pain, increased response rate, and decreased cost with ultrasound guided versus blind injection.  Verbal informed consent obtained.  Time-out conducted.  Noted no overlying erythema, induration, or other signs of local infection.  Skin prepped in a sterile fashion.  Local anesthesia: Topical Ethyl chloride.  With sterile technique and under real time ultrasound guidance:  The 25-gauge 1 inch needle patient was injected with 0.5 mL of 0.5% Marcaine and 0.5 mL of Kenalog 40 mg/dL into the midfoot joint over the fourth and fifth toes. Completed without difficulty  Pain immediately resolved suggesting accurate placement of the medication.  Advised to call if fevers/chills, erythema, induration, drainage, or persistent bleeding.  Images permanently stored and available for review in the ultrasound unit.  Impression: Technically successful ultrasound guided injection.    Impression and Recommendations:     This case required medical decision making of moderate complexity.

## 2014-09-24 NOTE — Patient Instructions (Addendum)
Good to see you [name] is your friend [name] to not wear the boot at home.   Keep trucking along and if not better in 1 week call and we will get MRI.

## 2014-09-24 NOTE — Progress Notes (Signed)
Pre visit review using our clinic review tool, if applicable. No additional management support is needed unless otherwise documented below in the visit note. 

## 2014-09-24 NOTE — Assessment & Plan Note (Signed)
Patient was treated for more of a midfoot arthritis. Patient continues to have difficulty advancing imaging would be necessary. Patient was given injections today as well as a smaller boot. Patient will start to transition over the course the next 2 weeks into regular shoes. Patient will follow-up with me again in 3 weeks or call sooner if advance imaging is necessary.

## 2014-10-07 DIAGNOSIS — E559 Vitamin D deficiency, unspecified: Secondary | ICD-10-CM | POA: Diagnosis not present

## 2014-10-07 DIAGNOSIS — E039 Hypothyroidism, unspecified: Secondary | ICD-10-CM | POA: Diagnosis not present

## 2014-10-07 DIAGNOSIS — E785 Hyperlipidemia, unspecified: Secondary | ICD-10-CM | POA: Diagnosis not present

## 2014-10-07 DIAGNOSIS — I10 Essential (primary) hypertension: Secondary | ICD-10-CM | POA: Diagnosis not present

## 2014-10-28 ENCOUNTER — Telehealth: Payer: Self-pay | Admitting: Family Medicine

## 2014-10-28 NOTE — Telephone Encounter (Signed)
I have electronically submitted pt's info for Prolia insurance verification and will notify you once I have a response. Thank you. °

## 2014-10-29 NOTE — Telephone Encounter (Signed)
I have rec'd pt's insurance verification for her Prolia injection.  Pt will have an estimated responsibility of $0.  Please advise her this is an estimate and we will not know an exact amt until after both insurances have paid.  I have sent a copy of the summary of benefits to be scanned into her chart.  Please let me know if you have any questions. Thank you.

## 2014-11-04 ENCOUNTER — Ambulatory Visit (INDEPENDENT_AMBULATORY_CARE_PROVIDER_SITE_OTHER): Payer: Medicare Other | Admitting: *Deleted

## 2014-11-04 DIAGNOSIS — M810 Age-related osteoporosis without current pathological fracture: Secondary | ICD-10-CM

## 2014-11-04 MED ORDER — DENOSUMAB 60 MG/ML ~~LOC~~ SOLN
60.0000 mg | Freq: Once | SUBCUTANEOUS | Status: AC
  Administered 2014-11-04: 60 mg via SUBCUTANEOUS

## 2014-11-04 NOTE — Telephone Encounter (Signed)
Pt received her prolia injection today.

## 2014-11-26 ENCOUNTER — Ambulatory Visit (INDEPENDENT_AMBULATORY_CARE_PROVIDER_SITE_OTHER)
Admission: RE | Admit: 2014-11-26 | Discharge: 2014-11-26 | Disposition: A | Discharge status: Home / Self Care | Payer: Medicare Other | Source: Ambulatory Visit | Attending: Family Medicine | Admitting: Family Medicine

## 2014-11-26 ENCOUNTER — Ambulatory Visit (INDEPENDENT_AMBULATORY_CARE_PROVIDER_SITE_OTHER): Payer: Medicare Other | Admitting: Family Medicine

## 2014-11-26 ENCOUNTER — Other Ambulatory Visit (INDEPENDENT_AMBULATORY_CARE_PROVIDER_SITE_OTHER): Payer: Medicare Other

## 2014-11-26 ENCOUNTER — Encounter: Payer: Self-pay | Admitting: Family Medicine

## 2014-11-26 VITALS — BP 116/72 | HR 73

## 2014-11-26 DIAGNOSIS — M79672 Pain in left foot: Secondary | ICD-10-CM | POA: Diagnosis not present

## 2014-11-26 DIAGNOSIS — M25572 Pain in left ankle and joints of left foot: Secondary | ICD-10-CM | POA: Diagnosis not present

## 2014-11-26 DIAGNOSIS — G5762 Lesion of plantar nerve, left lower limb: Secondary | ICD-10-CM | POA: Insufficient documentation

## 2014-11-26 NOTE — Patient Instructions (Addendum)
Good to see you I need an xray of your foot today.  We injected you 2 months ago and no improvement.  We tried neuroma injection today Continue everything else you are doing but try to switch to a boot and tennis shoe instead of boot.  See me again in 3-6 weeks.

## 2014-11-26 NOTE — Progress Notes (Signed)
[name] [address] [place], [place] [address] Phone: [phone] Subjective:    CC: Left foot pain  [id number] [name] is a 80 y.o. female coming in with complaint of left foot pain. Patient was found to have what appeared to be more of a Lisfranc injury as well as midfoot arthritis. Patient did have an injection in the midfoot 2 months ago. Patient was tried to change her shoes and stay active. Patient was to continue with bisphosphonate injections, icing, and avoiding certain activities. Patient states she has not made any significant improvement. Vision states that the pain is chain someone is more lateral of the foot. States it's more of a dull aching sensation and throbbing sensation. Not as much is a true sharp pain that she was having previously.  No past medical history on file. No past surgical history on file. Social History  Substance Use Topics  . Smoking status: Never Smoker   . Smokeless tobacco: None  . Alcohol Use: None   Not on File No family history on file.      Past medical history, social, surgical and family history all reviewed in electronic medical record.   Review of Systems: No headache, visual changes, nausea, vomiting, diarrhea, constipation, dizziness, abdominal pain, skin rash, fevers, chills, night sweats, weight loss, swollen lymph nodes, body aches, joint swelling, muscle aches, chest pain, shortness of breath, mood changes.   Objective Blood pressure 116/72, pulse 73, SpO2 99 %.  General: No apparent distress alert and oriented x3 mood and affect normal, dressed appropriately.  HEENT: Pupils equal, extraocular movements intact  Respiratory: Patient's speak in full sentences and does not appear short of breath  Cardiovascular: No lower extremity edema, non tender, no erythema  Skin: Warm dry intact with no signs of infection or rash on extremities or on axial skeleton.  Abdomen: Soft nontender  Neuro:  Cranial nerves II through XII are intact, neurovascularly intact in all extremities with 2+ DTRs and 2+ pulses.  Lymph: No lymphadenopathy of posterior or anterior cervical chain or axillae bilaterally.  Gait normal with good balance and coordination.  MSK:  Non tender with full range of motion and good stability and symmetric strength and tone of shoulders, elbows, wrist, hip, knees bilaterally.   Ankle: Left No swelling noted which is an improvement. Range of motion is full in all directions. Strength is 5/5 in all directions. Stable lateral and medial ligaments; squeeze test and kleiger test unremarkable;  No tenderness over N spot or navicular prominence No tenderness on posterior aspects of lateral and medial malleolus She will is tender to palpation over the dorsal aspect of the third and fourth toes but no longer over the midfoot Positive compression squeeze test of the foot No sign of peroneal tendon subluxations or tenderness to palpation Negative tarsal tunnel tinel's Able to walk 4 steps. Contralateral ankle unremarkable with no swelling  MSK US performed of: Left This study was ordered, performed, and interpreted by Charlann Boxer D.O.  Foot/Ankle:   Patient's capsulitis O seen between the fourth and fifth was improved from previous exam. Patient though does have what appears to be more of a neuroma noted between the fourth and fifth toes themselves. Hypoechoic changes and increasing Doppler flow noted.  Power doppler signal normal.  IMPRESSION: Morton's neuroma between the fourth and fifth intermetatarsal space   Procedure: Real-time Ultrasound Guided Injection of Morton's neuroma fourth and fifth intermetatarsal space  Device: GE Logiq E  Ultrasound guided injection is preferred based studies that show increased duration, increased effect, greater accuracy, decreased procedural pain, increased response rate, and decreased cost with ultrasound guided versus blind injection.    Verbal informed consent obtained.  Time-out conducted.  Noted no overlying erythema, induration, or other signs of local infection.  Skin prepped in a sterile fashion.  Local anesthesia: Topical Ethyl chloride.  With sterile technique and under real time ultrasound guidance:  The 25-gauge 1 inch needle patient was injected with 0.5 mL of 0.5% Marcaine and 0.5 mL of Kenalog 40 mg/dL into the neuroma noted. Completed without difficulty  Pain improved but not resolved suggesting accurate placement of the medication.  Advised to call if fevers/chills, erythema, induration, drainage, or persistent bleeding.  Images permanently stored and available for review in the ultrasound unit.  Impression: Technically successful ultrasound guided injection.    Impression and Recommendations:     This case required medical decision making of moderate complexity.

## 2014-11-26 NOTE — Progress Notes (Signed)
Pre visit review using our clinic review tool, if applicable. No additional management support is needed unless otherwise documented below in the visit note. 

## 2014-11-26 NOTE — Assessment & Plan Note (Signed)
Patient was given an injection today and tolerated the procedure well. We will get x-rays to rule out any other bony undergoing elective be contribute in. Patient did have some mild relief for this will be beneficial. Patient will continue the high-dose vitamin D weekly. We discussed proper shoes and patient can come out of the [place] as tolerated. Patient will come back in 2-3 weeks for further evaluation and treatment.

## 2014-12-15 ENCOUNTER — Other Ambulatory Visit: Payer: Self-pay | Admitting: *Deleted

## 2014-12-15 DIAGNOSIS — M25572 Pain in left ankle and joints of left foot: Secondary | ICD-10-CM

## 2014-12-15 DIAGNOSIS — M79672 Pain in left foot: Secondary | ICD-10-CM

## 2014-12-30 ENCOUNTER — Ambulatory Visit
Admission: RE | Admit: 2014-12-30 | Discharge: 2014-12-30 | Disposition: A | Discharge status: Home / Self Care | Payer: Medicare Other | Source: Ambulatory Visit | Attending: Family Medicine | Admitting: Family Medicine

## 2014-12-30 DIAGNOSIS — M79672 Pain in left foot: Secondary | ICD-10-CM | POA: Diagnosis not present

## 2014-12-30 DIAGNOSIS — M25572 Pain in left ankle and joints of left foot: Secondary | ICD-10-CM | POA: Diagnosis not present

## 2015-01-07 ENCOUNTER — Ambulatory Visit (INDEPENDENT_AMBULATORY_CARE_PROVIDER_SITE_OTHER): Payer: Medicare Other | Admitting: Family Medicine

## 2015-01-07 ENCOUNTER — Encounter: Payer: Self-pay | Admitting: Family Medicine

## 2015-01-07 ENCOUNTER — Other Ambulatory Visit (INDEPENDENT_AMBULATORY_CARE_PROVIDER_SITE_OTHER): Payer: Medicare Other

## 2015-01-07 VITALS — BP 112/74 | HR 80

## 2015-01-07 DIAGNOSIS — M129 Arthropathy, unspecified: Secondary | ICD-10-CM

## 2015-01-07 DIAGNOSIS — M79672 Pain in left foot: Secondary | ICD-10-CM | POA: Diagnosis not present

## 2015-01-07 DIAGNOSIS — M19079 Primary osteoarthritis, unspecified ankle and foot: Secondary | ICD-10-CM | POA: Insufficient documentation

## 2015-01-07 NOTE — Progress Notes (Signed)
Pre visit review using our clinic review tool, if applicable. No additional management support is needed unless otherwise documented below in the visit note. 

## 2015-01-07 NOTE — Patient Instructions (Addendum)
Good to see you The foot is arthritis overall.  Wear good shoes will always be important.  We can repeat injections every 3-4 months.  If not working would need to discuss with podiatry for possible surgery.  See me when you need me.

## 2015-01-07 NOTE — Assessment & Plan Note (Signed)
Patient's doing well overall. Patient is an injection today to help. We can repeat this every 3-4 months. We discussed the importance of proper shoewear. Discussed with continue the vitamin D supplementation. Patient come back and see me again in 3-4 months for further injections if necessary.

## 2015-01-07 NOTE — Progress Notes (Signed)
[name] [address] [place], [place] [address] Phone: [phone] Subjective:    CC: Left foot pain  [id number] [name] is a 80 y.o. female coming in with complaint of left foot pain. 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 Patient was given an injection back in July. Patient continues to have pain. Patient is doing vitamin D bisphosphonate. Patient continued to have pain and we did get an MRI to make sure there wasoccupying lesion. Patient's MRI only showed severe osteophytic changes of the midfoot. Seems to be posttraumatic. Patient states she continues to have mostly a soreness on the dorsal aspect of the foot. Describes it as more of a dull aching sensation. States it seems to be worse with activity. Not stopping her from activity but can be very uncomfortable. She states some shoes make it worse.  No past medical history on file. No past surgical history on file. Social History  Substance Use Topics  . Smoking status: Never Smoker   . Smokeless tobacco: None  . Alcohol Use: None   Not on File No family history on file.      Past medical history, social, surgical and family history all reviewed in electronic medical record.   Review of Systems: No headache, visual changes, nausea, vomiting, diarrhea, constipation, dizziness, abdominal pain, skin rash, fevers, chills, night sweats, weight loss, swollen lymph nodes, body aches, joint swelling, muscle aches, chest pain, shortness of breath, mood changes.   Objective There were no vitals taken for this visit.  General: No apparent distress alert and oriented x3 mood and affect normal, dressed appropriately.  HEENT: Pupils equal, extraocular movements intact  Respiratory: Patient's speak in full sentences and does not appear short of breath  Cardiovascular: No lower extremity edema, non tender, no erythema  Skin: Warm dry intact with no signs of infection  or rash on extremities or on axial skeleton.  Abdomen: Soft nontender  Neuro: Cranial nerves II through XII are intact, neurovascularly intact in all extremities with 2+ DTRs and 2+ pulses.  Lymph: No lymphadenopathy of posterior or anterior cervical chain or axillae bilaterally.  Gait normal with good balance and coordination.  MSK:  Non tender with full range of motion and good stability and symmetric strength and tone of shoulders, elbows, wrist, hip, knees bilaterally.   Ankle: Left No swelling noted which is an improvement. Range of motion is full in all directions. Strength is 5/5 in all directions. Stable lateral and medial ligaments; squeeze test and kleiger test unremarkable;  No tenderness over N spot or navicular prominence No tenderness on posterior aspects of lateral and medial malleolus Continued tenderness over the midfoot mostly over the third metatarsal region. No sign of peroneal tendon subluxations or tenderness to palpation Negative tarsal tunnel tinel's Able to walk 4 steps. Contralateral ankle unremarkable with no swelling  MSK US performed of: Left This study was ordered, performed, and interpreted by Charlann Boxer D.O.  Foot/Ankle:   Third metatarsal does have severe posttraumatic arthritis.  Power doppler signal normal.  IMPRESSION: Third metatarsal osteoarthritic changes   Procedure: Real-time Ultrasound Guided Injection of third metatarsal joint  Device: GE Logiq E  Ultrasound guided injection is preferred based studies that show increased duration, increased effect, greater accuracy, decreased procedural pain, increased response rate, and decreased cost with ultrasound guided versus blind injection.  Verbal informed consent obtained.  Time-out conducted.  Noted no overlying erythema, induration, or other signs of local infection.  Skin prepped in a sterile fashion.  Local anesthesia: Topical Ethyl chloride.  With sterile technique and under real time  ultrasound guidance:  The 25-gauge 1 inch needle patient was injected with 0.5 mL of 0.5% Marcaine and 0.5 mL of Kenalog 40 mg/dL into third metatarsal joint Completed without difficulty  Pain improved but not resolved suggesting accurate placement of the medication.  Advised to call if fevers/chills, erythema, induration, drainage, or persistent bleeding.  Images permanently stored and available for review in the ultrasound unit.  Impression: Technically successful ultrasound guided injection.    Impression and Recommendations:     This case required medical decision making of moderate complexity.

## 2015-01-11 DIAGNOSIS — Z23 Encounter for immunization: Secondary | ICD-10-CM | POA: Diagnosis not present

## 2015-01-18 DIAGNOSIS — Z23 Encounter for immunization: Secondary | ICD-10-CM | POA: Diagnosis not present

## 2015-03-17 ENCOUNTER — Encounter: Payer: Self-pay | Admitting: Family Medicine

## 2015-03-17 ENCOUNTER — Other Ambulatory Visit (INDEPENDENT_AMBULATORY_CARE_PROVIDER_SITE_OTHER): Payer: Medicare Other

## 2015-03-17 ENCOUNTER — Ambulatory Visit (INDEPENDENT_AMBULATORY_CARE_PROVIDER_SITE_OTHER): Payer: Medicare Other | Admitting: Family Medicine

## 2015-03-17 VITALS — BP 122/72 | HR 62

## 2015-03-17 DIAGNOSIS — M79672 Pain in left foot: Secondary | ICD-10-CM

## 2015-03-17 DIAGNOSIS — M129 Arthropathy, unspecified: Secondary | ICD-10-CM | POA: Diagnosis not present

## 2015-03-17 DIAGNOSIS — M19079 Primary osteoarthritis, unspecified ankle and foot: Secondary | ICD-10-CM

## 2015-03-17 NOTE — Progress Notes (Signed)
Pre visit review using our clinic review tool, if applicable. No additional management support is needed unless otherwise documented below in the visit note. 

## 2015-03-17 NOTE — Assessment & Plan Note (Addendum)
Worsening symptoms. Repeat injection given today. Discussed with her that we would like to avoid doing this to frequently. I do believe the patient is going to likely need possible surgical intervention. We may consider referral in the near future. We discussed icing regimen. We discussed proper shoewear. Patient will try to make these changes, I can see me again in 3 months if another injection is necessary. Referral will be placed today.  Spent  25 minutes with patient face-to-face and had greater than 50% of counseling including as described above in assessment and plan.

## 2015-03-17 NOTE — Progress Notes (Signed)
[name] [address] [place], [place] [address] Phone: [phone] Subjective:    CC: Left foot pain follow up  [id number] [name] is a 80 y.o. female coming in with complaint of left foot pain. 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 Patient was given an injection back in October. Patient states that it did not help a significant amount. Continue the vitamin D supplementation as well as her thyroid medications. Patient states pain was resolved for approximately 2 now weeks. States though unfortunately the pain is come back. Continues to give her discomfort. Patient states that she knows she will likely need surgery at some point because nothing else is help. Continues to use supplementations.  No past medical history on file. no significant past medical history is pertinent to the chief complaint No past surgical history on file. no pertinent past medical history that is pertinent to the chief complaint Social History  Substance Use Topics  . Smoking status: Never Smoker   . Smokeless tobacco: Not on file  . Alcohol Use: Not on file   Not on File No family history on file. no pertinent family history to chief complaint      Past medical history, social, surgical and family history all reviewed in electronic medical record.   Review of Systems: No headache, visual changes, nausea, vomiting, diarrhea, constipation, dizziness, abdominal pain, skin rash, fevers, chills, night sweats, weight loss, swollen lymph nodes, body aches, joint swelling, muscle aches, chest pain, shortness of breath, mood changes.   Objective Blood pressure 122/72, pulse 62, SpO2 97 %.  General: No apparent distress alert and oriented x3 mood and affect normal, dressed appropriately.  HEENT: Pupils equal, extraocular movements intact  Respiratory: Patient's speak in full sentences and does not appear short of breath  Cardiovascular: No  lower extremity edema, non tender, no erythema  Skin: Warm dry intact with no signs of infection or rash on extremities or on axial skeleton.  Abdomen: Soft nontender  Neuro: Cranial nerves II through XII are intact, neurovascularly intact in all extremities with 2+ DTRs and 2+ pulses.  Lymph: No lymphadenopathy of posterior or anterior cervical chain or axillae bilaterally.  Gait normal with good balance and coordination.  MSK:  Non tender with full range of motion and good stability and symmetric strength and tone of shoulders, elbows, wrist, hip, knees bilaterally.   Ankle: Left No swelling noted which is an improvement. Range of motion is full in all directions. Strength is 5/5 in all directions. Stable lateral and medial ligaments; squeeze test and kleiger test unremarkable; No tenderness over N spot or navicular prominence No tenderness on posterior aspects of lateral and medial malleolus Continued tenderness over the midfoot mostly over the third metatarsal region. Mild tenderness over the peroneal tendon. Negative tarsal tunnel tinel's Able to walk 4 steps. Contralateral ankle unremarkable with no swelling     Procedure: Real-time Ultrasound Guided Injection of third metatarsal joint  Device: GE Logiq E  Ultrasound guided injection is preferred based studies that show increased duration, increased effect, greater accuracy, decreased procedural pain, increased response rate, and decreased cost with ultrasound guided versus blind injection.  Verbal informed consent obtained.  Time-out conducted.  Noted no overlying erythema, induration, or other signs of local infection.  Skin prepped in a sterile fashion.  Local anesthesia: Topical Ethyl chloride.  With sterile technique and under real time ultrasound guidance:  The 25-gauge 1 inch needle patient was injected  with 0.5 mL of 0.5% Marcaine and 0.5 mL of Kenalog 40 mg/dL into third metatarsal joint Completed without difficulty    Pain improved but not resolved suggesting accurate placement of the medication.  Advised to call if fevers/chills, erythema, induration, drainage, or persistent bleeding.  Images permanently stored and available for review in the ultrasound unit.  Impression: Technically successful ultrasound guided injection.    Impression and Recommendations:     This case required medical decision making of moderate complexity.

## 2015-03-17 NOTE — Patient Instructions (Signed)
Always good to see you Try not to have too much fun on new years eve! Ice will always be good.  For the ankle try the exercises 3 times a week.  Wearing good shoes will always be helpful.  See me when you need me.  Happy New Year!

## 2015-04-15 DIAGNOSIS — E559 Vitamin D deficiency, unspecified: Secondary | ICD-10-CM | POA: Diagnosis not present

## 2015-04-15 DIAGNOSIS — Z6824 Body mass index (BMI) 24.0-24.9, adult: Secondary | ICD-10-CM | POA: Diagnosis not present

## 2015-04-15 DIAGNOSIS — I6340 Cerebral infarction due to embolism of unspecified cerebral artery: Secondary | ICD-10-CM | POA: Diagnosis not present

## 2015-04-15 DIAGNOSIS — E785 Hyperlipidemia, unspecified: Secondary | ICD-10-CM | POA: Diagnosis not present

## 2015-04-15 DIAGNOSIS — I10 Essential (primary) hypertension: Secondary | ICD-10-CM | POA: Diagnosis not present

## 2015-04-15 DIAGNOSIS — E039 Hypothyroidism, unspecified: Secondary | ICD-10-CM | POA: Diagnosis not present

## 2015-04-28 ENCOUNTER — Other Ambulatory Visit: Payer: Self-pay | Admitting: *Deleted

## 2015-04-28 DIAGNOSIS — M79672 Pain in left foot: Secondary | ICD-10-CM

## 2015-04-28 DIAGNOSIS — M19079 Primary osteoarthritis, unspecified ankle and foot: Secondary | ICD-10-CM

## 2015-05-03 ENCOUNTER — Ambulatory Visit (INDEPENDENT_AMBULATORY_CARE_PROVIDER_SITE_OTHER): Payer: Medicare Other | Admitting: Podiatry

## 2015-05-03 ENCOUNTER — Encounter: Payer: Self-pay | Admitting: Podiatry

## 2015-05-03 ENCOUNTER — Ambulatory Visit: Payer: Medicare Other

## 2015-05-03 ENCOUNTER — Ambulatory Visit (INDEPENDENT_AMBULATORY_CARE_PROVIDER_SITE_OTHER): Payer: Medicare Other

## 2015-05-03 DIAGNOSIS — G5762 Lesion of plantar nerve, left lower limb: Secondary | ICD-10-CM | POA: Diagnosis not present

## 2015-05-03 DIAGNOSIS — R52 Pain, unspecified: Secondary | ICD-10-CM

## 2015-05-03 DIAGNOSIS — D3610 Benign neoplasm of peripheral nerves and autonomic nervous system, unspecified: Secondary | ICD-10-CM | POA: Diagnosis not present

## 2015-05-03 NOTE — Progress Notes (Signed)
   Subjective:    Patient ID: [name], female    DOB: 05-Feb-1935, 81 y.o.   MRN: [id number]  HPI had an MRI done last year on my left foot and hurts on top and saw Dr [name] and he stated that it was an ankle sprain and was in a boot and went back two weeks later and he did not do anything and i did have a shot put in it This patient presents to the office with chief complaint of pain in her left foot. She says today she has pain in the forefoot of her left foot in the absence of redness, swelling. It was difficult obtaining the sequential history for this patient. She does admit that she had an ankle foot injury to her left foot in April 2016. She says she was under treatment for Dr. Tamala Todd and he treated her with a Vita Erm from April through December 2016. She also relates having injection therapy in the top of her left foot to help her bones feel better. She also says an MRI was taken that of the revealed that the metatarsal bones were healing in poor position. Reviewing Dr. Thompson Todd notes he initially thought she had a talar dome injury as well as a Gershon Crane injury, left foot.  Later he diagnosed her as having a neuroma in the fourth interspace of the left foot which she treated with ultrasound guided injection therapy. She presents the office today for an evaluation and a review of her left foot pain     Review of Systems  All other systems reviewed and are negative.      Objective:   Physical Exam GENERAL APPEARANCE: Alert,Conversant. Appropriately groomed. No acute distress.  VASCULAR: Pedal pulses palpable at  [facility] and PT bilateral.  Capillary refill time is immediate to all digits,  Normal temperature gradient.  Digital hair growth is present bilateral  NEUROLOGIC: sensation is normal to 5.07 monofilament at 5/5 sites bilateral.  Light touch is intact bilateral, Muscle strength normal.  MUSCULOSKELETAL: acceptable muscle strength, tone and stability bilateral.  Intrinsic muscluature  intact bilateral.  Rectus appearance of foot and digits noted bilateral. There are no evidence of redness or swelling or pain noted left foot.  No pain liz-frank joint.  Palpable pain third interspace left foot.  DERMATOLOGIC: skin color, texture, and turgor are within normal limits.  No preulcerative lesions or ulcers  are seen, no interdigital maceration noted.  No open lesions present.  Digital nails are asymptomatic. No drainage noted.        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  Metatarsalgia left foot.  IE  Xray reveal no bony pathology .  Injection therapy left foot.  Surgical shoe was dispensed.  Patient to RTC 2 weeks for reevaluation in 2 weeks.   Gardiner Barefoot DPM

## 2015-05-26 DIAGNOSIS — H2513 Age-related nuclear cataract, bilateral: Secondary | ICD-10-CM | POA: Diagnosis not present

## 2015-05-26 DIAGNOSIS — H10023 Other mucopurulent conjunctivitis, bilateral: Secondary | ICD-10-CM | POA: Diagnosis not present

## 2015-05-28 DIAGNOSIS — H2513 Age-related nuclear cataract, bilateral: Secondary | ICD-10-CM | POA: Diagnosis not present

## 2015-05-28 DIAGNOSIS — H10023 Other mucopurulent conjunctivitis, bilateral: Secondary | ICD-10-CM | POA: Diagnosis not present

## 2015-06-10 ENCOUNTER — Other Ambulatory Visit (INDEPENDENT_AMBULATORY_CARE_PROVIDER_SITE_OTHER): Payer: Medicare Other

## 2015-06-10 ENCOUNTER — Ambulatory Visit (INDEPENDENT_AMBULATORY_CARE_PROVIDER_SITE_OTHER): Payer: Medicare Other | Admitting: Family Medicine

## 2015-06-10 ENCOUNTER — Encounter: Payer: Self-pay | Admitting: Family Medicine

## 2015-06-10 DIAGNOSIS — M79672 Pain in left foot: Secondary | ICD-10-CM

## 2015-06-10 DIAGNOSIS — M129 Arthropathy, unspecified: Secondary | ICD-10-CM

## 2015-06-10 DIAGNOSIS — M19079 Primary osteoarthritis, unspecified ankle and foot: Secondary | ICD-10-CM

## 2015-06-10 NOTE — Progress Notes (Signed)
Pre visit review using our clinic review tool, if applicable. No additional management support is needed unless otherwise documented below in the visit note. 

## 2015-06-10 NOTE — Patient Instructions (Signed)
Good to see you  Once again sorry at this time See me again when you need me Consider seeing Reg[facility] or Dr. Doran Durand.  You know where I am if you need me.

## 2015-06-10 NOTE — Assessment & Plan Note (Signed)
Patient given another injection today with near complete resolution of pain. Hopefully this will be beneficial. Encourage her to continue with vitamin D supplementation. We discussed icing regimen. Patient will try some different shoes that I think will give her more stability. Patient and will come back and see me again on an as-needed basis. We did discuss potential referral to another provider if necessary and if she would like to look into surgical intervention.

## 2015-06-10 NOTE — Progress Notes (Signed)
[name] [place] [place], [place] [address] Phone: [phone] Subjective:    CC: Left foot pain follow up  [id number] [name] is a 81 y.o. female coming in with complaint of left foot pain. 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 Patient was given an injection multiple months ago. Patient was seen by podiatrist who also attempted injection with no significant improvement. Patient was sent to discuss fusion but they did not discuss it with them. Patient continues to have the pain. Doing a lot more activity outside. Patient needs to be able to walk better than she has at this time.  No past medical history on file. no significant past medical history is pertinent to the chief complaint No past surgical history on file. no pertinent past medical history that is pertinent to the chief complaint Social History  Substance Use Topics  . Smoking status: Never Smoker   . Smokeless tobacco: Not on file  . Alcohol Use: Not on file   Not on File No family history on file. no pertinent family history to chief complaint      Past medical history, social, surgical and family history all reviewed in electronic medical record.   Review of Systems: No headache, visual changes, nausea, vomiting, diarrhea, constipation, dizziness, abdominal pain, skin rash, fevers, chills, night sweats, weight loss, swollen lymph nodes, body aches, joint swelling, muscle aches, chest pain, shortness of breath, mood changes.   Objective There were no vitals taken for this visit.  General: No apparent distress alert and oriented x3 mood and affect normal, dressed appropriately.  HEENT: Pupils equal, extraocular movements intact  Respiratory: Patient's speak in full sentences and does not appear short of breath  Cardiovascular: No lower extremity edema, non tender, no erythema  Skin: Warm dry intact with no signs of infection or rash on  extremities or on axial skeleton.  Abdomen: Soft nontender  Neuro: Cranial nerves II through XII are intact, neurovascularly intact in all extremities with 2+ DTRs and 2+ pulses.  Lymph: No lymphadenopathy of posterior or anterior cervical chain or axillae bilaterally.  Gait normal with good balance and coordination.  MSK:  Non tender with full range of motion and good stability and symmetric strength and tone of shoulders, elbows, wrist, hip, knees bilaterally.   Ankle: Left No swelling noted which is an improvement. Range of motion is full in all directions. Strength is 5/5 in all directions. Stable lateral and medial ligaments; squeeze test and kleiger test unremarkable; No tenderness over N spot or navicular prominence No tenderness on posterior aspects of lateral and medial malleolus Continued tenderness over the midfoot mostly over the third metatarsal region. Mild tenderness over the peroneal tendon. Negative tarsal tunnel tinel's Able to walk 4 steps. Contralateral ankle unremarkable with no swelling TTP of the IP of 2nd and 3rd toes. And metatarsal joints.    Procedure: Real-time Ultrasound Guided Injection of third metatarsal joint  Device: GE Logiq E  Ultrasound guided injection is preferred based studies that show increased duration, increased effect, greater accuracy, decreased procedural pain, increased response rate, and decreased cost with ultrasound guided versus blind injection.  Verbal informed consent obtained.  Time-out conducted.  Noted no overlying erythema, induration, or other signs of local infection.  Skin prepped in a sterile fashion.  Local anesthesia: Topical Ethyl chloride.  With sterile technique and under real time ultrasound guidance:  The 25-gauge 1 inch needle patient was injected with 0.5  mL of 0.5% Marcaine and 0.5 mL of Kenalog 40 mg/dL into third metatarsal joint Completed without difficulty  Pain improved but not resolved suggesting accurate  placement of the medication.  Advised to call if fevers/chills, erythema, induration, drainage, or persistent bleeding.  Images permanently stored and available for review in the ultrasound unit.  Impression: Technically successful ultrasound guided injection.    Impression and Recommendations:     This case required medical decision making of moderate complexity.

## 2015-06-14 ENCOUNTER — Telehealth: Payer: Self-pay | Admitting: Podiatry

## 2015-06-14 NOTE — Telephone Encounter (Signed)
lvm for pt to call to schedule an appt with Dr Jacqualyn Posey per Dr Paulla Dolly who spoke to Dr Tamala Julian. Pt needs surgical consult for possible fusion

## 2015-07-28 ENCOUNTER — Other Ambulatory Visit: Payer: Self-pay

## 2015-07-28 ENCOUNTER — Encounter: Payer: Self-pay | Admitting: Family Medicine

## 2015-07-28 ENCOUNTER — Ambulatory Visit (INDEPENDENT_AMBULATORY_CARE_PROVIDER_SITE_OTHER): Payer: Medicare Other | Admitting: Family Medicine

## 2015-07-28 VITALS — BP 104/74 | HR 66

## 2015-07-28 DIAGNOSIS — M129 Arthropathy, unspecified: Secondary | ICD-10-CM

## 2015-07-28 DIAGNOSIS — M19079 Primary osteoarthritis, unspecified ankle and foot: Secondary | ICD-10-CM

## 2015-07-28 DIAGNOSIS — M79672 Pain in left foot: Secondary | ICD-10-CM

## 2015-07-28 MED ORDER — GABAPENTIN 100 MG PO CAPS: 100.0000 mg | ORAL_CAPSULE | Freq: Every day | ORAL | Status: DC

## 2015-07-28 NOTE — Assessment & Plan Note (Signed)
Discussed with patient at great length. If worsening symptoms we need to consider further treatment options. Patient does not want to wear a boot. Patient was to avoid any other type of surgical intervention at this time. We will continue to do injections or on an as-needed basis. We discussed proper shoewear again. Follow-up 10 weeks for repeat injection.

## 2015-07-28 NOTE — Patient Instructions (Signed)
Iron 65mg  with 500mg  of vitamin C 3 times a week Gabapentin 100mg  at night See me when you need me

## 2015-07-28 NOTE — Progress Notes (Signed)
[name] [address], [place] [address] Phone: [phone] Subjective:    CC: Left foot pain follow up  [id number] [name] is a 81 y.o. female coming in with complaint of left foot pain. 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 Patient's last injection on 9 weeks ago. Starting have worsening foot pain again. Patient understands the risks of repeat injections. Does not want any surgical intervention. Tries to the right shoes but is not doing regularly. Does take vitamin D and a regular basis. Having more cramping at night.  No past medical history on file. no significant past medical history is pertinent to the chief complaint No past surgical history on file. no pertinent past medical history that is pertinent to the chief complaint Social History  Substance Use Topics  . Smoking status: Never Smoker   . Smokeless tobacco: Not on file  . Alcohol Use: Not on file   Not on File No family history on file. no pertinent family history to chief complaint      Past medical history, social, surgical and family history all reviewed in electronic medical record.   Review of Systems: No headache, visual changes, nausea, vomiting, diarrhea, constipation, dizziness, abdominal pain, skin rash, fevers, chills, night sweats, weight loss, swollen lymph nodes, body aches, joint swelling, muscle aches, chest pain, shortness of breath, mood changes.   Objective Blood pressure 104/74, pulse 66, SpO2 98 %.  General: No apparent distress alert and oriented x3 mood and affect normal, dressed appropriately.  HEENT: Pupils equal, extraocular movements intact  Respiratory: Patient's speak in full sentences and does not appear short of breath  Cardiovascular: No lower extremity edema, non tender, no erythema  Skin: Warm dry intact with no signs of infection or rash on extremities or on axial skeleton.  Abdomen: Soft nontender    Neuro: Cranial nerves II through XII are intact, neurovascularly intact in all extremities with 2+ DTRs and 2+ pulses.  Lymph: No lymphadenopathy of posterior or anterior cervical chain or axillae bilaterally.  Gait normal with good balance and coordination.  MSK:  Non tender with full range of motion and good stability and symmetric strength and tone of shoulders, elbows, wrist, hip, knees bilaterally.   Ankle: Left No swelling noted which is an improvement. Range of motion is full in all directions. Strength is 5/5 in all directions. Stable lateral and medial ligaments; squeeze test and kleiger test unremarkable; No tenderness over N spot or navicular prominence No tenderness on posterior aspects of lateral and medial malleolus Continued tenderness over the midfoot mostly over the third metatarsal region. Mild tenderness over the peroneal tendon. Negative tarsal tunnel tinel's Able to walk 4 steps. Contralateral ankle unremarkable with no swelling TTP of the IP of 2nd and 3rd toes. And metatarsal joints.    Procedure: Real-time Ultrasound Guided Injection of third metatarsal joint  Device: GE Logiq E  Ultrasound guided injection is preferred based studies that show increased duration, increased effect, greater accuracy, decreased procedural pain, increased response rate, and decreased cost with ultrasound guided versus blind injection.  Verbal informed consent obtained.  Time-out conducted.  Noted no overlying erythema, induration, or other signs of local infection.  Skin prepped in a sterile fashion.  Local anesthesia: Topical Ethyl chloride.  With sterile technique and under real time ultrasound guidance:  The 25-gauge 1 inch needle patient was injected with 0.5 mL of 0.5% Marcaine and 0.5 mL of Kenalog 40 mg/dL  into third metatarsal joint Completed without difficulty  Pain improved but not resolved suggesting accurate placement of the medication.  Advised to call if  fevers/chills, erythema, induration, drainage, or persistent bleeding.  Images permanently stored and available for review in the ultrasound unit.  Impression: Technically successful ultrasound guided injection.    Impression and Recommendations:     This case required medical decision making of moderate complexity.

## 2015-07-30 ENCOUNTER — Other Ambulatory Visit: Payer: Self-pay | Admitting: Family Medicine

## 2015-08-23 DIAGNOSIS — Z1231 Encounter for screening mammogram for malignant neoplasm of breast: Secondary | ICD-10-CM | POA: Diagnosis not present

## 2015-09-22 ENCOUNTER — Encounter: Payer: Self-pay | Admitting: Family Medicine

## 2015-09-22 ENCOUNTER — Other Ambulatory Visit: Payer: Self-pay

## 2015-09-22 ENCOUNTER — Ambulatory Visit (INDEPENDENT_AMBULATORY_CARE_PROVIDER_SITE_OTHER): Payer: Medicare Other | Admitting: Family Medicine

## 2015-09-22 VITALS — BP 126/72 | HR 65

## 2015-09-22 DIAGNOSIS — M129 Arthropathy, unspecified: Secondary | ICD-10-CM

## 2015-09-22 DIAGNOSIS — M19079 Primary osteoarthritis, unspecified ankle and foot: Secondary | ICD-10-CM

## 2015-09-22 NOTE — Progress Notes (Signed)
[name] [place],  [address] Phone: [phone] Subjective:    CC: Left foot pain follow up  [id number] [name] is a 81 y.o. female coming in with complaint of left foot pain. 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 Patient's last injection on 8 weeks ago. Having worsening symptoms. Making it difficult to do daily activities.has been wearing good shoes but states that not making significant improvement. Wants to avoid any surgical intervention.  No past medical history on file. no significant past medical history is pertinent to the chief complaint No past surgical history on file. no pertinent past medical history that is pertinent to the chief complaint Social History  Substance Use Topics  . Smoking status: Never Smoker   . Smokeless tobacco: None  . Alcohol Use: None   Not on File No family history on file. no pertinent family history to chief complaintno family history of autoimmune diseases      Past medical history, social, surgical and family history all reviewed in electronic medical record.   Review of Systems: No headache, visual changes, nausea, vomiting, diarrhea, constipation, dizziness, abdominal pain, skin rash, fevers, chills, night sweats, weight loss, swollen lymph nodes, body aches, joint swelling, muscle aches, chest pain, shortness of breath, mood changes.   Objective Blood pressure 126/72, pulse 65, SpO2 98 %.  General: No apparent distress alert and oriented x3 mood and affect normal, dressed appropriately.  HEENT: Pupils equal, extraocular movements intact  Respiratory: Patient's speak in full sentences and does not appear short of breath  Cardiovascular: No lower extremity edema, non tender, no erythema  Skin: Warm dry intact with no signs of infection or rash on extremities or on axial skeleton.  Abdomen: Soft nontender  Neuro: Cranial nerves II through XII  are intact, neurovascularly intact in all extremities with 2+ DTRs and 2+ pulses.  Lymph: No lymphadenopathy of posterior or anterior cervical chain or axillae bilaterally.  Gait normal with good balance and coordination.  MSK:  Non tender with full range of motion and good stability and symmetric strength and tone of shoulders, elbows, wrist, hip, knees bilaterally.   Ankle: Left No swelling noted which is an improvement. Range of motion is full in all directions. Strength is 5/5 in all directions. Stable lateral and medial ligaments; squeeze test and kleiger test unremarkable; No tenderness over N spot or navicular prominence No tenderness on posterior aspects of lateral and medial malleolus Continued tenderness over the midfoot mostly over the third metatarsal region. Mild tenderness over the peroneal tendon. Negative tarsal tunnel tinel's Able to walk 4 steps. Contralateral ankle unremarkable with no swelling Tender to palpation from mid foot 2 through 5.   Procedure: Real-time Ultrasound Guided Injection of third metatarsal joint  Device: GE Logiq E  Ultrasound guided injection is preferred based studies that show increased duration, increased effect, greater accuracy, decreased procedural pain, increased response rate, and decreased cost with ultrasound guided versus blind injection.  Verbal informed consent obtained.  Time-out conducted.  Noted no overlying erythema, induration, or other signs of local infection.  Skin prepped in a sterile fashion.  Local anesthesia: Topical Ethyl chloride.  With sterile technique and under real time ultrasound guidance:  The 25-gauge 1 inch needle patient was injected with 0.5 mL of 0.5% Marcaine and 0.5 mL of Kenalog 40 mg/dL into third metatarsal joint Completed without difficulty  Pain improved but not resolved suggesting accurate placement of the medication.  Advised to call if fevers/chills, erythema, induration, drainage, or persistent  bleeding.  Images permanently stored and available for review in the ultrasound unit.  Impression: Technically successful ultrasound guided injection.    Impression and Recommendations:     This case required medical decision making of moderate complexity.

## 2015-09-22 NOTE — Patient Instructions (Signed)
Good to see you  Ice is your friend We injected the foot again  You know the drill  See me when you need me.

## 2015-09-22 NOTE — Assessment & Plan Note (Signed)
Patient given another injection today. Tolerated the procedure well. Complete resolution of pain after the injection. We discussed icing regimen. Continue to good shoes. We discussed that we could increase patient's gabapentin but she is concern of side effects. Patient knows surgical intervention is the next been put patient wants to avoid. Patient will come back and see me again on an as-needed basis. Discussed with her would like to try to space and injections in 3 month intervals of possible.

## 2015-09-22 NOTE — Progress Notes (Signed)
Pre visit review using our clinic review tool, if applicable. No additional management support is needed unless otherwise documented below in the visit note. 

## 2015-10-14 DIAGNOSIS — E785 Hyperlipidemia, unspecified: Secondary | ICD-10-CM | POA: Diagnosis not present

## 2015-10-14 DIAGNOSIS — I10 Essential (primary) hypertension: Secondary | ICD-10-CM | POA: Diagnosis not present

## 2015-10-14 DIAGNOSIS — E039 Hypothyroidism, unspecified: Secondary | ICD-10-CM | POA: Diagnosis not present

## 2015-10-14 DIAGNOSIS — I6340 Cerebral infarction due to embolism of unspecified cerebral artery: Secondary | ICD-10-CM | POA: Diagnosis not present

## 2015-10-14 DIAGNOSIS — M84375G Stress fracture, left foot, subsequent encounter for fracture with delayed healing: Secondary | ICD-10-CM | POA: Diagnosis not present

## 2015-10-14 DIAGNOSIS — E559 Vitamin D deficiency, unspecified: Secondary | ICD-10-CM | POA: Diagnosis not present

## 2015-11-15 ENCOUNTER — Other Ambulatory Visit: Payer: Self-pay

## 2015-12-21 DIAGNOSIS — E039 Hypothyroidism, unspecified: Secondary | ICD-10-CM | POA: Diagnosis not present

## 2016-01-16 NOTE — Progress Notes (Signed)
[name] [place], [place] [address] Phone: [phone] Subjective:   On the following information with his been evaluated in great detail and is current as of 01/17/16  CC: Left foot pain follow up  [id number] [name] is a 81 y.o. female coming in with complaint of left foot pain. 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 Patient's last injection nearly 4 months ago. Patient states that overall she has started having increasing pain again. Has been doing yard work recently. States that if she wears not the right shoe she starts having increasing pain again. Denies any fever, chills, any abnormal weight loss. Symptoms.  No past medical history on file. no significant past medical history is pertinent to the chief complaint No past surgical history on file. no pertinent past medical history that is pertinent to the chief complaint     Social History  Substance Use Topics  . Smoking status: Never Smoker   . Smokeless tobacco: None  . Alcohol Use: None   Not on File No family history on file. no pertinent family history to chief complaintno family history of autoimmune diseases      Past medical history, social, surgical and family history all reviewed in electronic medical record.   Review of Systems: No headache, visual changes, nausea, vomiting, diarrhea, constipation, dizziness, abdominal pain, skin rash, fevers, chills, night sweats, weight loss, swollen lymph nodes, body aches, joint swelling, muscle aches, chest pain, shortness of breath, mood changes.   Objective Blood pressure 126/72, pulse 65, SpO2 98 %.  General: No apparent distress alert and oriented x3 mood and affect normal, dressed appropriately.  HEENT: Pupils equal, extraocular movements intact  Respiratory: Patient's speak in full sentences and does not appear short of breath  Cardiovascular: No lower extremity edema, non  tender, no erythema  Skin: Warm dry intact with no signs of infection or rash on extremities or on axial skeleton.  Abdomen: Soft nontender  Neuro: Cranial nerves II through XII are intact, neurovascularly intact in all extremities with 2+ DTRs and 2+ pulses.  Lymph: No lymphadenopathy of posterior or anterior cervical chain or axillae bilaterally.  Gait normal with good balance and coordination.  MSK:  Non tender with full range of motion and good stability and symmetric strength and tone of shoulders, elbows, wrist, hip, knees bilaterally.   Ankle: Left No swelling noted which is an improvement. Range of motion is full in all directions of the ankle Strength is 5/5 in all directions. Stable lateral and medial ligaments; squeeze test and kleiger test unremarkable; No tenderness over N spot or navicular prominence No tenderness on posterior aspects of lateral and medial malleolus Continued tenderness over the midfoot mostly over the third metatarsal region. Nontender over the peroneal tendons Negative tarsal tunnel tinel's Able to walk 4 steps. Contralateral ankle unremarkable with no swelling Continued tenderness of the midfoot mostly 2 through 5. Especially over the third metatarsal joint.   Procedure: Real-time Ultrasound Guided Injection of third metatarsal joint Device: GE Logiq E  Ultrasound guided injection is preferred based studies that show increased duration, increased effect, greater accuracy, decreased procedural pain, increased response rate, and decreased cost with ultrasound guided versus blind injection.  Verbal informed consent obtained.  Time-out conducted.  Noted no overlying erythema, induration, or other signs of local infection.  Skin prepped in a sterile fashion.  Local anesthesia: Topical Ethyl chloride.  With sterile technique and under real time ultrasound  guidance:  The 25-gauge 1 inch needle patient was injected with 0.5 mL of 0.5% Marcaine and 0.5 mL of  Kenalog 40 mg/dL into third metatarsal joint Completed without difficulty  Pain immediately resolved suggesting accurate placement of the medication.  Advised to call if fevers/chills, erythema, induration, drainage, or persistent bleeding.  Images permanently stored and available for review in the ultrasound unit.  Impression: Technically successful ultrasound guided injection.

## 2016-01-17 ENCOUNTER — Ambulatory Visit (INDEPENDENT_AMBULATORY_CARE_PROVIDER_SITE_OTHER): Payer: Medicare Other | Admitting: Family Medicine

## 2016-01-17 ENCOUNTER — Encounter: Payer: Self-pay | Admitting: Family Medicine

## 2016-01-17 ENCOUNTER — Ambulatory Visit: Payer: Self-pay

## 2016-01-17 VITALS — BP 112/72 | HR 80

## 2016-01-17 DIAGNOSIS — M19079 Primary osteoarthritis, unspecified ankle and foot: Secondary | ICD-10-CM

## 2016-01-17 DIAGNOSIS — M79672 Pain in left foot: Secondary | ICD-10-CM | POA: Diagnosis not present

## 2016-01-17 DIAGNOSIS — G5762 Lesion of plantar nerve, left lower limb: Secondary | ICD-10-CM | POA: Diagnosis not present

## 2016-01-17 NOTE — Assessment & Plan Note (Signed)
Patient was given another injection today. Tolerated the procedure well. We discussed icing regimen and home exercises again. We discussed the possibility of custom orthotics which patient declined at the time. Patient will continue to be active. We know that we can repeat this injection every 3-4 months if necessary. Patient will hopefully start to have some longer improvement. Patient will come back and see me again as needed.

## 2016-01-17 NOTE — Assessment & Plan Note (Signed)
Noted again at this time but does not seem to be the aggravating factor. Continue on the gabapentin low dose.

## 2016-01-17 NOTE — Patient Instructions (Signed)
Good to see you  Ice is your friend    

## 2016-01-19 DIAGNOSIS — Z23 Encounter for immunization: Secondary | ICD-10-CM | POA: Diagnosis not present

## 2016-02-02 IMAGING — CR DG ANKLE COMPLETE 3+V*L*
3 series · 3 of 3 positions shown · non-contrast
Comparison: None in PACs

CLINICAL DATA: Left ankle foot injury in June 2014 with persistent
foot pain radiating to the ankle

EXAM:
LEFT FOOT - COMPLETE 3+ VIEW; LEFT ANKLE COMPLETE - 3+ VIEW

[view not recorded (1 of 3)]
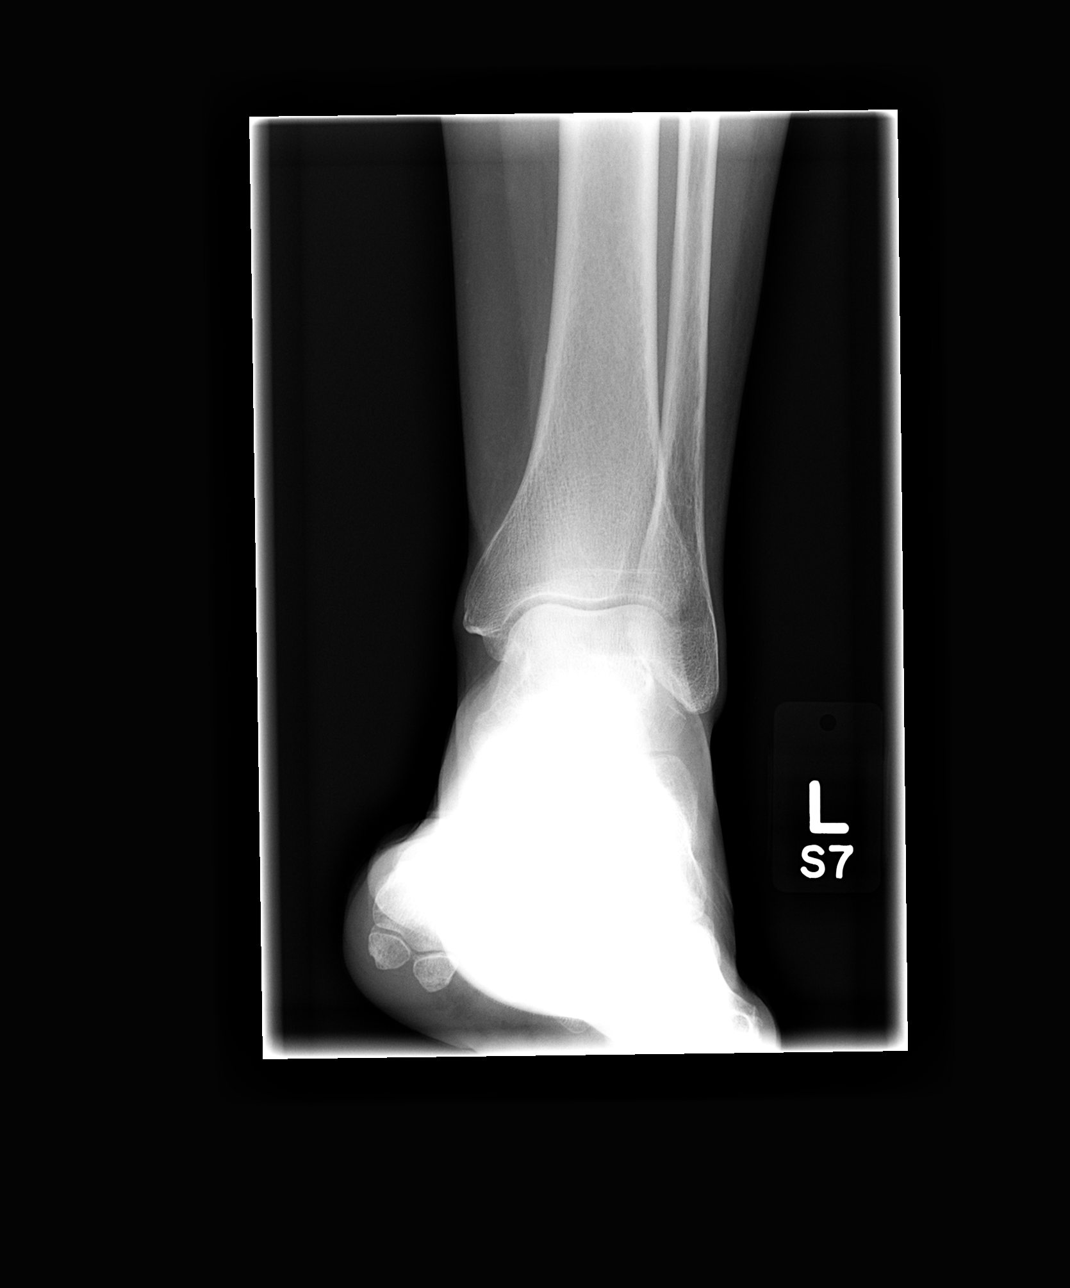

[view not recorded (2 of 3)]
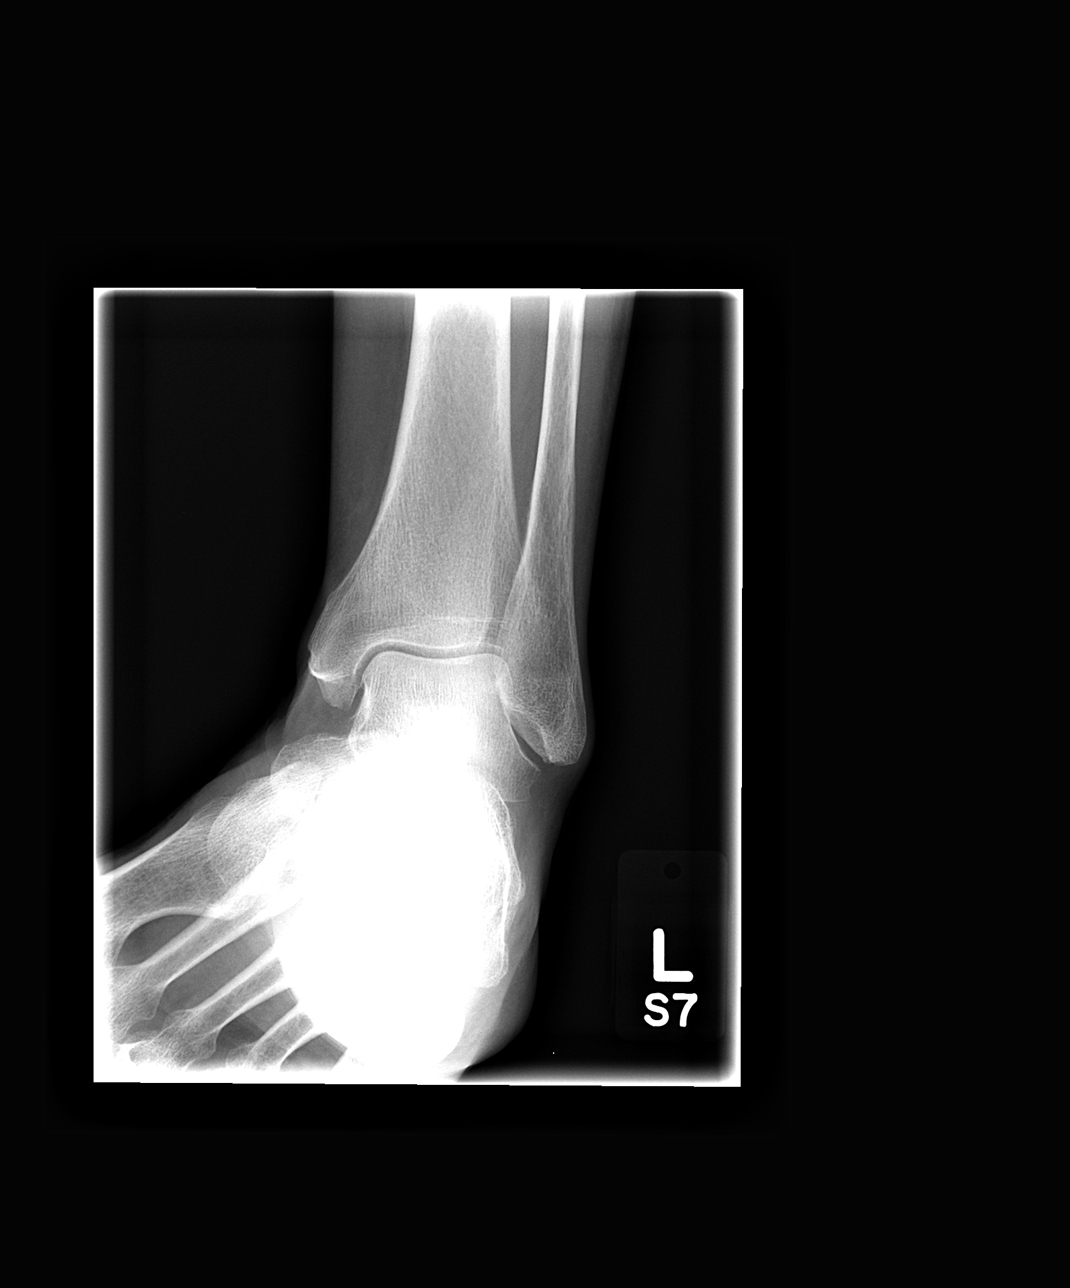

[view not recorded (3 of 3)]
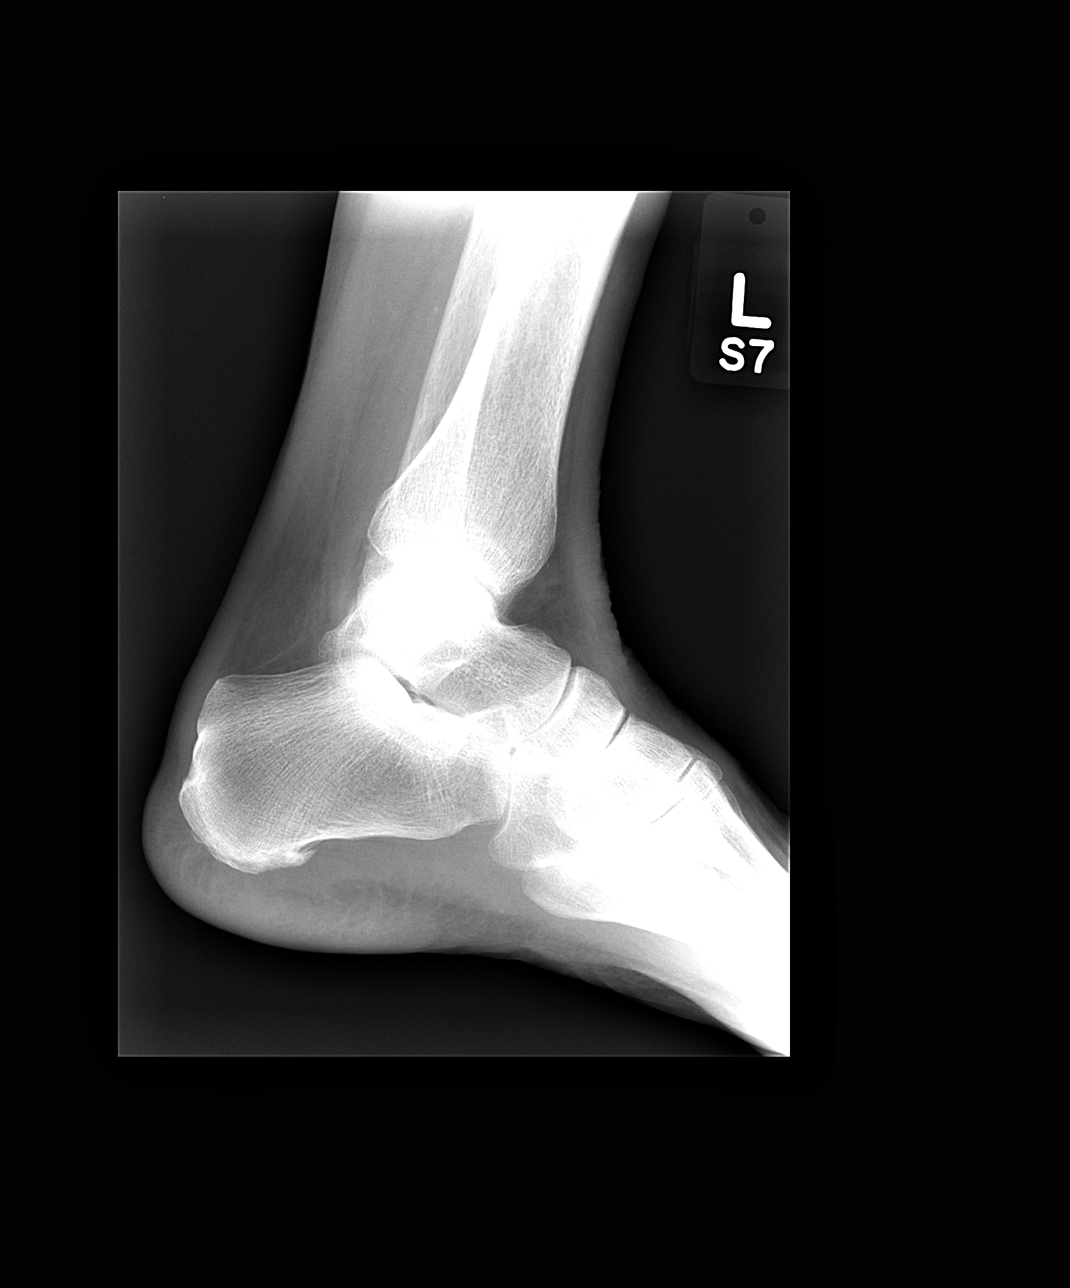

[3 of 3 positions shown; findings below may reference images not displayed]

FINDINGS: Left foot: The bones of the foot are adequately mineralized. The
interphalangeal, MTP, and TMT joints are unremarkable. The
intertarsal joints appear normal as well. There is a small plantar
calcaneal spur. There is no lytic or blastic bony lesion nor
evidence of periosteal reaction. The bony structures are
unremarkable.

Left ankle: The ankle joint mortise is preserved. The talar dome is
intact. There is no acute malleolar fracture. The soft tissues are
unremarkable.
IMPRESSION: There is no acute or significant chronic bony abnormality of the
left foot or left ankle. If the patient's symptoms persist and
remain unexplained, MRI would be a useful next imaging step.

## 2016-03-30 ENCOUNTER — Other Ambulatory Visit: Payer: Self-pay | Admitting: Family Medicine

## 2016-03-30 NOTE — Telephone Encounter (Signed)
Refill done.  

## 2016-04-19 DIAGNOSIS — E039 Hypothyroidism, unspecified: Secondary | ICD-10-CM | POA: Diagnosis not present

## 2016-04-19 DIAGNOSIS — E782 Mixed hyperlipidemia: Secondary | ICD-10-CM | POA: Diagnosis not present

## 2016-04-19 DIAGNOSIS — I6350 Cerebral infarction due to unspecified occlusion or stenosis of unspecified cerebral artery: Secondary | ICD-10-CM | POA: Diagnosis not present

## 2016-04-19 DIAGNOSIS — E038 Other specified hypothyroidism: Secondary | ICD-10-CM | POA: Diagnosis not present

## 2016-05-10 ENCOUNTER — Ambulatory Visit: Payer: Self-pay

## 2016-05-10 ENCOUNTER — Ambulatory Visit (INDEPENDENT_AMBULATORY_CARE_PROVIDER_SITE_OTHER): Payer: Medicare Other | Admitting: Family Medicine

## 2016-05-10 VITALS — BP 118/78 | HR 63

## 2016-05-10 DIAGNOSIS — M79673 Pain in unspecified foot: Secondary | ICD-10-CM

## 2016-05-10 DIAGNOSIS — M19079 Primary osteoarthritis, unspecified ankle and foot: Secondary | ICD-10-CM | POA: Diagnosis not present

## 2016-05-10 NOTE — Assessment & Plan Note (Signed)
Patient is having worsening symptoms again. We didn't do injection. We did discuss the possibility of possible surgical intervention may be needed in the long run which patient declined adamantly. We discussed continuing the conservative therapy. Attempts gabapentin on a more regular basis. Patient will follow-up with me again. Married in 3 months for repeat injections if needed.

## 2016-05-10 NOTE — Patient Instructions (Addendum)
Good to see you  [name] is your friend.  You know the drill  Gabapentin 200mg  at night See me again in 3 months.

## 2016-05-10 NOTE — Progress Notes (Signed)
  [name] [address] [place], [place] [address] Phone: [phone] Subjective:   On the following information with his been evaluated in great detail and is current as of 05/10/16  CC: Left foot pain follow up  [id number] [name] is a 82 y.o. female coming in with complaint of left foot pain. 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 Patient has not been seen for 4 months. Started having increasing pain again. States that it is changing the way she walks. Starting to affect daily activities. Throbbing pain at night. Worsening symptoms that seems to be the same symptoms though.  No numbness, no home remedies are helping.   No past medical history on file. Patient Active Problem List   Diagnosis Date Noted  . Arthritis, midfoot 01/07/2015  . Morton's neuroma of left foot 11/26/2014  . Left foot pain 09/10/2014   No past surgical history on file. Social History  Substance Use Topics  . Smoking status: Never Smoker  . Smokeless tobacco: Not on file  . Alcohol use Not on file   Not on File no known drug allergies No family history of autoimmune diseases.    Past medical history, social, surgical and family history all reviewed in electronic medical record.   Review of Systems: No headache, visual changes, nausea, vomiting, diarrhea, constipation, dizziness, abdominal pain, skin rash, fevers, chills, night sweats, weight loss, swollen lymph nodes, body aches,  chest pain, shortness of breath, mood changes.  + muscle aches.   Objective Blood pressure 126/72, pulse 65, SpO2 98 %.  General: No apparent distress alert and oriented x3 mood and affect normal, dressed appropriately.  HEENT: Pupils equal, extraocular movements intact  Respiratory: Patient's speak in full sentences and does not appear short of breath  Cardiovascular: No lower extremity edema, non tender, no erythema  Skin: Warm dry intact with  no signs of infection or rash on extremities or on axial skeleton.  Abdomen: Soft nontender  Neuro: Cranial nerves II through XII are intact, neurovascularly intact in all extremities with 2+ DTRs and 2+ pulses.  Lymph: No lymphadenopathy of posterior or anterior cervical chain or axillae bilaterally.  Gait antalgic gait MSK:  Non tender with full range of motion and good stability and symmetric strength and tone of shoulders, elbows, wrist, hip, knees bilaterally. Severe arthritic changes of multiple joints Left foot shows the patient does have a pes cavus with the regimen midfoot. Severe tenderness over the third metatarsal again. No numbness noted. Negative squeeze test.  Procedure: Real-time Ultrasound Guided Injection of third metatarsal joint Device: GE Logiq Q7 Ultrasound guided injection is preferred based studies that show increased duration, increased effect, greater accuracy, decreased procedural pain, increased response rate, and decreased cost with ultrasound guided versus blind injection.  Verbal informed consent obtained.  Time-out conducted.  Noted no overlying erythema, induration, or other signs of local infection.  Skin prepped in a sterile fashion.  Local anesthesia: Topical Ethyl chloride.  With sterile technique and under real time ultrasound guidance:  With a 25-gauge half-inch needle patient was injected in the third metatarsal joint with 0.5 mL of 0.5% Marcaine and 0.5 mL of Kenalog 40 mg/dL. Completed without difficulty  Pain immediately resolved suggesting accurate placement of the medication.  Advised to call if fevers/chills, erythema, induration, drainage, or persistent bleeding.  Images permanently stored and available for review in the ultrasound unit.  Impression: Technically successful ultrasound guided injection.

## 2016-08-21 DIAGNOSIS — E782 Mixed hyperlipidemia: Secondary | ICD-10-CM | POA: Diagnosis not present

## 2016-08-21 DIAGNOSIS — E038 Other specified hypothyroidism: Secondary | ICD-10-CM | POA: Diagnosis not present

## 2016-08-21 DIAGNOSIS — R072 Precordial pain: Secondary | ICD-10-CM | POA: Diagnosis not present

## 2016-08-21 DIAGNOSIS — E559 Vitamin D deficiency, unspecified: Secondary | ICD-10-CM | POA: Diagnosis not present

## 2016-08-21 DIAGNOSIS — I69323 Fluency disorder following cerebral infarction: Secondary | ICD-10-CM | POA: Diagnosis not present

## 2016-08-30 ENCOUNTER — Ambulatory Visit: Payer: Self-pay

## 2016-08-30 ENCOUNTER — Encounter: Payer: Self-pay | Admitting: Family Medicine

## 2016-08-30 ENCOUNTER — Ambulatory Visit (INDEPENDENT_AMBULATORY_CARE_PROVIDER_SITE_OTHER): Payer: Medicare Other | Admitting: Family Medicine

## 2016-08-30 VITALS — BP 126/72 | HR 58

## 2016-08-30 DIAGNOSIS — G8929 Other chronic pain: Secondary | ICD-10-CM

## 2016-08-30 DIAGNOSIS — M19079 Primary osteoarthritis, unspecified ankle and foot: Secondary | ICD-10-CM

## 2016-08-30 DIAGNOSIS — M79672 Pain in left foot: Secondary | ICD-10-CM

## 2016-08-30 NOTE — Assessment & Plan Note (Signed)
Patient was given another injection today. Worsening pain.  Hopefully will help  Will get custom orthotics.  RTC in 10 weeks.

## 2016-08-30 NOTE — Progress Notes (Signed)
  [name] [name], [place] [address] Phone: [phone] Subjective:   On the following information with his been evaluated in great detail and is current as of 08/30/16  CC: Left foot pain follow up  [name] is a 82 y.o. female coming in with complaint of left foot pain. 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 Has been quite some time since we seen patient. Having more pain over the third and fourth metatarsals. Patient states it is severe. Stopping her from daily activities. Sometimes has been even wearing her Cam Walker secondary to the pain.  No past medical history on file. Patient Active Problem List   Diagnosis Date Noted  . Arthritis, midfoot 01/07/2015  . Morton's neuroma of left foot 11/26/2014  . Left foot pain 09/10/2014   No past surgical history on file. Social History  Substance Use Topics  . Smoking status: Never Smoker  . Smokeless tobacco: Never Used  . Alcohol use Not on file   Not on File no known drug allergies No family history of autoimmune diseases.    Past medical history, social, surgical and family history all reviewed in electronic medical record.   Review of Systems: No headache, visual changes, nausea, vomiting, diarrhea, constipation, dizziness, abdominal pain, skin rash, fevers, chills, night sweats, weight loss, swollen lymph nodes, body aches, joint swelling, muscle aches, chest pain, shortness of breath, mood changes.    Objective Blood pressure 126/72, pulse 65, SpO2 98 %.  Systems examined below as of 08/30/16 General: NAD A&O x3 mood, affect normal  HEENT: Pupils equal, extraocular movements intact no nystagmus Respiratory: not short of breath at rest or with speaking Cardiovascular: No lower extremity edema, non tender Skin: Warm dry intact with no signs of infection or rash on extremities or on axial skeleton. Abdomen: Soft  nontender, no masses Neuro: Cranial nerves  intact, neurovascularly intact in all extremities with 2+ DTRs and 2+ pulses. Lymph: No lymphadenopathy appreciated today  Gait normal with good balance and coordination.  MSK: Non tender with full range of motion and good stability and symmetric strength and tone of shoulders, elbows, wrist,  knee hips and ankles bilaterally.  Arthritic changes of multiple joints. Patient is a bilateral knee replacement  Left foot exam shows the patient is severely tender over the third and fourth metatarsals at the midfoot. Patient does have severe breakdown of the transverse arch. Neurovascular intact distally.  Procedure: Real-time Ultrasound Guided Injection of  third metatarsal joint Device: GE Logiq Q7 Ultrasound guided injection is preferred based studies that show increased duration, increased effect, greater accuracy, decreased procedural pain, increased response rate, and decreased cost with ultrasound guided versus blind injection.  Verbal informed consent obtained.  Time-out conducted.  Noted no overlying erythema, induration, or other signs of local infection.  Skin prepped in a sterile fashion.  Local anesthesia: Topical Ethyl chloride.  With sterile technique and under real time ultrasound guidance:  With a 25-gauge 1 inch needle patient was injected with 0.5 mL of 0.5% Marcaine and 0.5 mL of Kenalog 40 g/dL Completed without difficulty  Pain immediately resolved suggesting accurate placement of the medication.  Advised to call if fevers/chills, erythema, induration, drainage, or persistent bleeding.  Images permanently stored and available for review in the ultrasound unit.  Impression: Technically successful ultrasound guided injection.

## 2016-08-30 NOTE — Patient Instructions (Signed)
Great to see you!   

## 2016-09-06 ENCOUNTER — Ambulatory Visit (INDEPENDENT_AMBULATORY_CARE_PROVIDER_SITE_OTHER): Payer: Medicare Other | Admitting: Family Medicine

## 2016-09-06 DIAGNOSIS — M19079 Primary osteoarthritis, unspecified ankle and foot: Secondary | ICD-10-CM | POA: Diagnosis not present

## 2016-09-06 MED ORDER — VITAMIN D (ERGOCALCIFEROL) 1.25 MG (50000 UNIT) PO CAPS: ORAL_CAPSULE | ORAL | 0 refills | Status: DC

## 2016-09-06 NOTE — Assessment & Plan Note (Signed)
Placed in custom orthotics today. Patient will wear them slowly increase wear over the course of time. We discussed adjustments may be necessary and patient will come back in 2-4 weeks.

## 2016-09-06 NOTE — Progress Notes (Signed)
Procedure Note   Patient was fitted for a : Comfort standard, cushioned, semi-rigid orthotic. The orthotic was heated and afterward the patient patient seated position and molded The patient was positioned in subtalar neutral position and 10 degrees of ankle dorsiflexion in a weight bearing stance. After completion of molding, patient did have orthotic management The blank was ground to a stable position for weight bearing. Size: 6.5 Base: Carbon fiber  Additional Posting and Padding: Left transverse arch 250/35; Medial longitudinal arch: 250/70; Lateral longitudinal arch: 250/35 Right foot:  No transverse arch; Medial longitudinal arch: 250/70; Lateral longitudinal arch: 250/35  The patient ambulated these, and they were very comfortable.

## 2016-11-27 DIAGNOSIS — H2513 Age-related nuclear cataract, bilateral: Secondary | ICD-10-CM | POA: Diagnosis not present

## 2016-12-05 NOTE — Progress Notes (Signed)
[name] [address] [place], [place] [address] Phone: [phone] Subjective:    I'm seeing this patient by the request  of:    CC: Mid foot arthritis follow-up  [id number]  [name] is a 82 y.o. female coming in with complaint of foot arthritis. Patient has been seen has had an injection and ring and to 10 weeks file worsening foot pain. Patient has been wearing proper shoes as well as somewhat the custom orthotics. Patient states Having worsening pain again. Seems to be more of the midfoot been down at the toes. No numbness. Patient has been doing a lot more yard work recently. Not wearing the orthotics as regularly. Worsening symptoms.    No past medical history on file. No past surgical history on file. Social History   Social History  . Marital status: Widowed    Spouse name: N/A  . Number of children: N/A  . Years of education: N/A   Social History Main Topics  . Smoking status: Never Smoker  . Smokeless tobacco: Never Used  . Alcohol use None  . Drug use: Unknown  . Sexual activity: Not Asked   Other Topics Concern  . None   Social History Narrative  . None   Not on File No family history on file. No Family history of autoimmune disease   Past medical history, social, surgical and family history all reviewed in electronic medical record.  No pertanent information unless stated regarding to the chief complaint.   Review of Systems:Review of systems updated and as accurate as of 12/06/16  No headache, visual changes, nausea, vomiting, diarrhea, constipation, dizziness, abdominal pain, skin rash, fevers, chills, night sweats, weight loss, swollen lymph nodes, body aches, joint swelling, muscle aches, chest pain, shortness of breath, mood changes. Positive muscle aches  Objective  Blood pressure 130/82, pulse 74, height 5' 6.5" (1.689 m), weight 135 lb (61.2 kg), SpO2 97 %. Systems examined below as of 12/06/16   General:  No apparent distress alert and oriented x3 mood and affect normal, dressed appropriately.  HEENT: Pupils equal, extraocular movements intact  Respiratory: Patient's speak in full sentences and does not appear short of breath  Cardiovascular: No lower extremity edema, non tender, no erythema  Skin: Warm dry intact with no signs of infection or rash on extremities or on axial skeleton.  Abdomen: Soft nontender  Neuro: Cranial nerves II through XII are intact, neurovascularly intact in all extremities with 2+ DTRs and 2+ pulses.  Lymph: No lymphadenopathy of posterior or anterior cervical chain or axillae bilaterally.  Gait antalgic MSK:  Non tender with full range of motion and good stability and symmetric strength and tone of shoulders, elbows, wrist, hip, and ankles bilaterally. Severe arthritic changes of multiple joints. Patient does have bilateral knee replacement Foot exam on the left foot shows the patient does have a rigid midfoot. Patient does have pain over the midfoot itself. Breakdown of the transverse arch noted significantly. Neurovascular intact distally. Mild decreased range of motion of the ankle.   Procedure: Real-time Ultrasound Guided Injection of left midfoot Device: GE Logiq Q7 Ultrasound guided injection is preferred based studies that show increased duration, increased effect, greater accuracy, decreased procedural pain, increased response rate, and decreased cost with ultrasound guided versus blind injection.  Verbal informed consent obtained.  Time-out conducted.  Noted no overlying erythema, induration, or other signs of local infection.  Skin prepped in a sterile fashion.  Local anesthesia: Topical Ethyl  chloride.  With sterile technique and under real time ultrasound guidance:  With a 25-gauge half-inch needle patient was injected with a total of 0.5 mL of 0.5% Marcaine and 0.5 mL of Kenalog 40 mg/dL and the third metatarsal midfoot juncture. Completed without  difficulty  Pain immediately resolved suggesting accurate placement of the medication.  Advised to call if fevers/chills, erythema, induration, drainage, or persistent bleeding.  Images permanently stored and available for review in the ultrasound unit.  Impression: Technically successful ultrasound guided injection.    Impression and Recommendations:     This case required medical decision making of moderate complexity.      Note: This dictation was prepared with Dragon dictation along with smaller phrase technology. Any transcriptional errors that result from this process are unintentional.

## 2016-12-06 ENCOUNTER — Ambulatory Visit (INDEPENDENT_AMBULATORY_CARE_PROVIDER_SITE_OTHER): Payer: Medicare Other | Admitting: Family Medicine

## 2016-12-06 ENCOUNTER — Ambulatory Visit: Payer: Self-pay

## 2016-12-06 ENCOUNTER — Encounter: Payer: Self-pay | Admitting: Family Medicine

## 2016-12-06 VITALS — BP 130/82 | HR 74 | Ht 66.5 in | Wt 135.0 lb

## 2016-12-06 DIAGNOSIS — M19079 Primary osteoarthritis, unspecified ankle and foot: Secondary | ICD-10-CM

## 2016-12-06 DIAGNOSIS — Z1231 Encounter for screening mammogram for malignant neoplasm of breast: Secondary | ICD-10-CM | POA: Diagnosis not present

## 2016-12-06 DIAGNOSIS — M79672 Pain in left foot: Secondary | ICD-10-CM | POA: Diagnosis not present

## 2016-12-06 NOTE — Assessment & Plan Note (Addendum)
Worsening symptoms. Continue conservative therapy otherwise. Trial of topical anti-inflammatories given, continue the once weekly vitamin D and the gabapentin at night. Follow-up again in 3 months

## 2016-12-20 DIAGNOSIS — E559 Vitamin D deficiency, unspecified: Secondary | ICD-10-CM | POA: Diagnosis not present

## 2016-12-20 DIAGNOSIS — E782 Mixed hyperlipidemia: Secondary | ICD-10-CM | POA: Diagnosis not present

## 2016-12-20 DIAGNOSIS — R072 Precordial pain: Secondary | ICD-10-CM | POA: Diagnosis not present

## 2016-12-20 DIAGNOSIS — I69323 Fluency disorder following cerebral infarction: Secondary | ICD-10-CM | POA: Diagnosis not present

## 2016-12-20 DIAGNOSIS — E038 Other specified hypothyroidism: Secondary | ICD-10-CM | POA: Diagnosis not present

## 2016-12-20 DIAGNOSIS — Z23 Encounter for immunization: Secondary | ICD-10-CM | POA: Diagnosis not present

## 2017-06-20 DIAGNOSIS — E559 Vitamin D deficiency, unspecified: Secondary | ICD-10-CM | POA: Diagnosis not present

## 2017-06-20 DIAGNOSIS — E782 Mixed hyperlipidemia: Secondary | ICD-10-CM | POA: Diagnosis not present

## 2017-06-20 DIAGNOSIS — E038 Other specified hypothyroidism: Secondary | ICD-10-CM | POA: Diagnosis not present

## 2017-06-20 DIAGNOSIS — I69323 Fluency disorder following cerebral infarction: Secondary | ICD-10-CM | POA: Diagnosis not present

## 2017-12-25 DIAGNOSIS — R42 Dizziness and giddiness: Secondary | ICD-10-CM | POA: Diagnosis not present

## 2017-12-25 DIAGNOSIS — R634 Abnormal weight loss: Secondary | ICD-10-CM | POA: Diagnosis not present

## 2017-12-25 DIAGNOSIS — Z6821 Body mass index (BMI) 21.0-21.9, adult: Secondary | ICD-10-CM | POA: Diagnosis not present

## 2017-12-25 DIAGNOSIS — Z23 Encounter for immunization: Secondary | ICD-10-CM | POA: Diagnosis not present

## 2017-12-25 DIAGNOSIS — E038 Other specified hypothyroidism: Secondary | ICD-10-CM | POA: Diagnosis not present

## 2017-12-25 DIAGNOSIS — E559 Vitamin D deficiency, unspecified: Secondary | ICD-10-CM | POA: Diagnosis not present

## 2017-12-25 DIAGNOSIS — E538 Deficiency of other specified B group vitamins: Secondary | ICD-10-CM | POA: Diagnosis not present

## 2017-12-25 DIAGNOSIS — I69323 Fluency disorder following cerebral infarction: Secondary | ICD-10-CM | POA: Diagnosis not present

## 2017-12-25 DIAGNOSIS — E782 Mixed hyperlipidemia: Secondary | ICD-10-CM | POA: Diagnosis not present

## 2018-01-07 DIAGNOSIS — R195 Other fecal abnormalities: Secondary | ICD-10-CM | POA: Diagnosis not present

## 2018-01-07 DIAGNOSIS — Z1211 Encounter for screening for malignant neoplasm of colon: Secondary | ICD-10-CM | POA: Diagnosis not present

## 2018-01-08 DIAGNOSIS — R195 Other fecal abnormalities: Secondary | ICD-10-CM | POA: Diagnosis not present

## 2018-01-08 DIAGNOSIS — R634 Abnormal weight loss: Secondary | ICD-10-CM | POA: Diagnosis not present

## 2018-01-31 DIAGNOSIS — D509 Iron deficiency anemia, unspecified: Secondary | ICD-10-CM | POA: Diagnosis not present

## 2018-02-06 ENCOUNTER — Other Ambulatory Visit: Payer: Self-pay

## 2018-02-07 DIAGNOSIS — R634 Abnormal weight loss: Secondary | ICD-10-CM | POA: Diagnosis not present

## 2018-02-07 DIAGNOSIS — Z90710 Acquired absence of both cervix and uterus: Secondary | ICD-10-CM | POA: Diagnosis not present

## 2018-02-07 DIAGNOSIS — Z79899 Other long term (current) drug therapy: Secondary | ICD-10-CM | POA: Diagnosis not present

## 2018-02-07 DIAGNOSIS — K222 Esophageal obstruction: Secondary | ICD-10-CM | POA: Diagnosis not present

## 2018-02-07 DIAGNOSIS — D509 Iron deficiency anemia, unspecified: Secondary | ICD-10-CM | POA: Diagnosis not present

## 2018-02-07 DIAGNOSIS — K2210 Ulcer of esophagus without bleeding: Secondary | ICD-10-CM | POA: Diagnosis not present

## 2018-02-07 DIAGNOSIS — K219 Gastro-esophageal reflux disease without esophagitis: Secondary | ICD-10-CM | POA: Diagnosis not present

## 2018-02-07 DIAGNOSIS — R195 Other fecal abnormalities: Secondary | ICD-10-CM | POA: Diagnosis not present

## 2018-02-07 DIAGNOSIS — C182 Malignant neoplasm of ascending colon: Secondary | ICD-10-CM | POA: Diagnosis not present

## 2018-02-07 DIAGNOSIS — D124 Benign neoplasm of descending colon: Secondary | ICD-10-CM | POA: Diagnosis not present

## 2018-02-07 DIAGNOSIS — D126 Benign neoplasm of colon, unspecified: Secondary | ICD-10-CM | POA: Diagnosis not present

## 2018-02-07 DIAGNOSIS — D122 Benign neoplasm of ascending colon: Secondary | ICD-10-CM | POA: Diagnosis not present

## 2018-02-07 DIAGNOSIS — K648 Other hemorrhoids: Secondary | ICD-10-CM | POA: Diagnosis not present

## 2018-02-07 DIAGNOSIS — K449 Diaphragmatic hernia without obstruction or gangrene: Secondary | ICD-10-CM | POA: Diagnosis not present

## 2018-02-07 DIAGNOSIS — Z8673 Personal history of transient ischemic attack (TIA), and cerebral infarction without residual deficits: Secondary | ICD-10-CM | POA: Diagnosis not present

## 2018-02-07 DIAGNOSIS — Z7902 Long term (current) use of antithrombotics/antiplatelets: Secondary | ICD-10-CM | POA: Diagnosis not present

## 2018-02-11 DIAGNOSIS — C189 Malignant neoplasm of colon, unspecified: Secondary | ICD-10-CM | POA: Diagnosis not present

## 2018-02-11 DIAGNOSIS — D649 Anemia, unspecified: Secondary | ICD-10-CM | POA: Diagnosis not present

## 2018-02-12 DIAGNOSIS — D649 Anemia, unspecified: Secondary | ICD-10-CM | POA: Diagnosis not present

## 2018-02-18 DIAGNOSIS — C182 Malignant neoplasm of ascending colon: Secondary | ICD-10-CM | POA: Insufficient documentation

## 2018-02-18 DIAGNOSIS — C189 Malignant neoplasm of colon, unspecified: Secondary | ICD-10-CM | POA: Diagnosis not present

## 2018-02-18 DIAGNOSIS — K8020 Calculus of gallbladder without cholecystitis without obstruction: Secondary | ICD-10-CM | POA: Diagnosis not present

## 2018-02-18 DIAGNOSIS — K769 Liver disease, unspecified: Secondary | ICD-10-CM | POA: Diagnosis not present

## 2018-02-20 DIAGNOSIS — C182 Malignant neoplasm of ascending colon: Secondary | ICD-10-CM | POA: Diagnosis not present

## 2018-02-27 DIAGNOSIS — Z7902 Long term (current) use of antithrombotics/antiplatelets: Secondary | ICD-10-CM | POA: Diagnosis not present

## 2018-02-27 DIAGNOSIS — C182 Malignant neoplasm of ascending colon: Secondary | ICD-10-CM | POA: Diagnosis present

## 2018-02-27 DIAGNOSIS — Z8673 Personal history of transient ischemic attack (TIA), and cerebral infarction without residual deficits: Secondary | ICD-10-CM | POA: Diagnosis not present

## 2018-02-27 DIAGNOSIS — E039 Hypothyroidism, unspecified: Secondary | ICD-10-CM | POA: Diagnosis present

## 2018-02-27 DIAGNOSIS — Z79899 Other long term (current) drug therapy: Secondary | ICD-10-CM | POA: Diagnosis not present

## 2018-02-27 DIAGNOSIS — K8010 Calculus of gallbladder with chronic cholecystitis without obstruction: Secondary | ICD-10-CM | POA: Diagnosis present

## 2018-02-27 DIAGNOSIS — C772 Secondary and unspecified malignant neoplasm of intra-abdominal lymph nodes: Secondary | ICD-10-CM | POA: Diagnosis not present

## 2018-03-06 DIAGNOSIS — D649 Anemia, unspecified: Secondary | ICD-10-CM | POA: Diagnosis not present

## 2018-03-06 DIAGNOSIS — C182 Malignant neoplasm of ascending colon: Secondary | ICD-10-CM | POA: Diagnosis not present

## 2018-03-06 DIAGNOSIS — E079 Disorder of thyroid, unspecified: Secondary | ICD-10-CM | POA: Diagnosis not present

## 2018-03-06 DIAGNOSIS — Z8673 Personal history of transient ischemic attack (TIA), and cerebral infarction without residual deficits: Secondary | ICD-10-CM | POA: Diagnosis not present

## 2018-03-06 DIAGNOSIS — Z483 Aftercare following surgery for neoplasm: Secondary | ICD-10-CM | POA: Diagnosis not present

## 2018-03-06 DIAGNOSIS — Z85038 Personal history of other malignant neoplasm of large intestine: Secondary | ICD-10-CM | POA: Diagnosis not present

## 2018-03-19 DIAGNOSIS — Z483 Aftercare following surgery for neoplasm: Secondary | ICD-10-CM | POA: Diagnosis not present

## 2018-03-19 DIAGNOSIS — C182 Malignant neoplasm of ascending colon: Secondary | ICD-10-CM | POA: Diagnosis not present

## 2018-03-20 HISTORY — PX: COLON SURGERY: SHX602

## 2018-03-27 DIAGNOSIS — R7989 Other specified abnormal findings of blood chemistry: Secondary | ICD-10-CM | POA: Diagnosis not present

## 2018-03-27 DIAGNOSIS — K769 Liver disease, unspecified: Secondary | ICD-10-CM | POA: Diagnosis not present

## 2018-03-27 DIAGNOSIS — C182 Malignant neoplasm of ascending colon: Secondary | ICD-10-CM | POA: Diagnosis not present

## 2018-03-27 DIAGNOSIS — C778 Secondary and unspecified malignant neoplasm of lymph nodes of multiple regions: Secondary | ICD-10-CM | POA: Diagnosis not present

## 2018-03-27 DIAGNOSIS — D473 Essential (hemorrhagic) thrombocythemia: Secondary | ICD-10-CM | POA: Diagnosis not present

## 2018-03-27 DIAGNOSIS — D509 Iron deficiency anemia, unspecified: Secondary | ICD-10-CM | POA: Diagnosis not present

## 2018-04-10 DIAGNOSIS — Z8673 Personal history of transient ischemic attack (TIA), and cerebral infarction without residual deficits: Secondary | ICD-10-CM | POA: Diagnosis not present

## 2018-04-10 DIAGNOSIS — Z7902 Long term (current) use of antithrombotics/antiplatelets: Secondary | ICD-10-CM | POA: Diagnosis not present

## 2018-04-10 DIAGNOSIS — E079 Disorder of thyroid, unspecified: Secondary | ICD-10-CM | POA: Diagnosis not present

## 2018-04-10 DIAGNOSIS — Z79899 Other long term (current) drug therapy: Secondary | ICD-10-CM | POA: Diagnosis not present

## 2018-04-10 DIAGNOSIS — Z4682 Encounter for fitting and adjustment of non-vascular catheter: Secondary | ICD-10-CM | POA: Diagnosis not present

## 2018-04-10 DIAGNOSIS — C182 Malignant neoplasm of ascending colon: Secondary | ICD-10-CM | POA: Diagnosis not present

## 2018-04-10 DIAGNOSIS — D649 Anemia, unspecified: Secondary | ICD-10-CM | POA: Diagnosis not present

## 2018-04-23 DIAGNOSIS — C182 Malignant neoplasm of ascending colon: Secondary | ICD-10-CM | POA: Diagnosis not present

## 2018-04-30 DIAGNOSIS — I69323 Fluency disorder following cerebral infarction: Secondary | ICD-10-CM | POA: Diagnosis not present

## 2018-04-30 DIAGNOSIS — D500 Iron deficiency anemia secondary to blood loss (chronic): Secondary | ICD-10-CM | POA: Diagnosis not present

## 2018-04-30 DIAGNOSIS — E559 Vitamin D deficiency, unspecified: Secondary | ICD-10-CM | POA: Diagnosis not present

## 2018-04-30 DIAGNOSIS — C182 Malignant neoplasm of ascending colon: Secondary | ICD-10-CM | POA: Diagnosis not present

## 2018-04-30 DIAGNOSIS — E440 Moderate protein-calorie malnutrition: Secondary | ICD-10-CM | POA: Diagnosis not present

## 2018-04-30 DIAGNOSIS — E782 Mixed hyperlipidemia: Secondary | ICD-10-CM | POA: Diagnosis not present

## 2018-04-30 DIAGNOSIS — E038 Other specified hypothyroidism: Secondary | ICD-10-CM | POA: Diagnosis not present

## 2018-04-30 DIAGNOSIS — C772 Secondary and unspecified malignant neoplasm of intra-abdominal lymph nodes: Secondary | ICD-10-CM | POA: Diagnosis not present

## 2018-04-30 DIAGNOSIS — Z681 Body mass index (BMI) 19 or less, adult: Secondary | ICD-10-CM | POA: Diagnosis not present

## 2018-05-21 DIAGNOSIS — C778 Secondary and unspecified malignant neoplasm of lymph nodes of multiple regions: Secondary | ICD-10-CM | POA: Diagnosis not present

## 2018-05-21 DIAGNOSIS — C182 Malignant neoplasm of ascending colon: Secondary | ICD-10-CM | POA: Diagnosis not present

## 2018-05-21 DIAGNOSIS — D509 Iron deficiency anemia, unspecified: Secondary | ICD-10-CM | POA: Diagnosis not present

## 2018-05-22 DIAGNOSIS — C182 Malignant neoplasm of ascending colon: Secondary | ICD-10-CM | POA: Diagnosis not present

## 2018-06-04 DIAGNOSIS — Z452 Encounter for adjustment and management of vascular access device: Secondary | ICD-10-CM | POA: Diagnosis not present

## 2018-06-04 DIAGNOSIS — C182 Malignant neoplasm of ascending colon: Secondary | ICD-10-CM | POA: Diagnosis not present

## 2018-06-13 DIAGNOSIS — C182 Malignant neoplasm of ascending colon: Secondary | ICD-10-CM | POA: Diagnosis not present

## 2018-07-10 DIAGNOSIS — C182 Malignant neoplasm of ascending colon: Secondary | ICD-10-CM | POA: Diagnosis not present

## 2018-07-31 DIAGNOSIS — H2513 Age-related nuclear cataract, bilateral: Secondary | ICD-10-CM | POA: Diagnosis not present

## 2018-08-01 DIAGNOSIS — D649 Anemia, unspecified: Secondary | ICD-10-CM | POA: Diagnosis not present

## 2018-08-01 DIAGNOSIS — R5383 Other fatigue: Secondary | ICD-10-CM | POA: Diagnosis not present

## 2018-08-01 DIAGNOSIS — C182 Malignant neoplasm of ascending colon: Secondary | ICD-10-CM | POA: Diagnosis not present

## 2018-08-29 DIAGNOSIS — C182 Malignant neoplasm of ascending colon: Secondary | ICD-10-CM | POA: Diagnosis not present

## 2018-09-26 DIAGNOSIS — C182 Malignant neoplasm of ascending colon: Secondary | ICD-10-CM | POA: Diagnosis not present

## 2018-09-26 DIAGNOSIS — D500 Iron deficiency anemia secondary to blood loss (chronic): Secondary | ICD-10-CM | POA: Diagnosis not present

## 2018-09-26 DIAGNOSIS — R970 Elevated carcinoembryonic antigen [CEA]: Secondary | ICD-10-CM | POA: Diagnosis not present

## 2018-09-30 DIAGNOSIS — Z452 Encounter for adjustment and management of vascular access device: Secondary | ICD-10-CM | POA: Diagnosis not present

## 2018-09-30 DIAGNOSIS — C182 Malignant neoplasm of ascending colon: Secondary | ICD-10-CM | POA: Diagnosis not present

## 2018-10-07 DIAGNOSIS — L237 Allergic contact dermatitis due to plants, except food: Secondary | ICD-10-CM | POA: Diagnosis not present

## 2018-10-14 DIAGNOSIS — L309 Dermatitis, unspecified: Secondary | ICD-10-CM | POA: Diagnosis not present

## 2018-10-14 DIAGNOSIS — L299 Pruritus, unspecified: Secondary | ICD-10-CM | POA: Diagnosis not present

## 2018-10-27 DIAGNOSIS — Z452 Encounter for adjustment and management of vascular access device: Secondary | ICD-10-CM | POA: Diagnosis not present

## 2018-11-06 DIAGNOSIS — Z452 Encounter for adjustment and management of vascular access device: Secondary | ICD-10-CM | POA: Diagnosis not present

## 2018-11-06 DIAGNOSIS — Z8673 Personal history of transient ischemic attack (TIA), and cerebral infarction without residual deficits: Secondary | ICD-10-CM | POA: Diagnosis not present

## 2018-11-06 DIAGNOSIS — C182 Malignant neoplasm of ascending colon: Secondary | ICD-10-CM | POA: Diagnosis not present

## 2018-11-06 DIAGNOSIS — E079 Disorder of thyroid, unspecified: Secondary | ICD-10-CM | POA: Diagnosis not present

## 2018-11-18 DIAGNOSIS — C182 Malignant neoplasm of ascending colon: Secondary | ICD-10-CM | POA: Diagnosis not present

## 2018-11-18 DIAGNOSIS — Z452 Encounter for adjustment and management of vascular access device: Secondary | ICD-10-CM | POA: Diagnosis not present

## 2018-12-16 DIAGNOSIS — H2513 Age-related nuclear cataract, bilateral: Secondary | ICD-10-CM | POA: Diagnosis not present

## 2018-12-26 DIAGNOSIS — Z23 Encounter for immunization: Secondary | ICD-10-CM | POA: Diagnosis not present

## 2018-12-26 DIAGNOSIS — C182 Malignant neoplasm of ascending colon: Secondary | ICD-10-CM | POA: Diagnosis not present

## 2018-12-26 DIAGNOSIS — D500 Iron deficiency anemia secondary to blood loss (chronic): Secondary | ICD-10-CM | POA: Diagnosis not present

## 2019-02-03 DIAGNOSIS — C182 Malignant neoplasm of ascending colon: Secondary | ICD-10-CM | POA: Diagnosis not present

## 2019-02-27 DIAGNOSIS — Z9049 Acquired absence of other specified parts of digestive tract: Secondary | ICD-10-CM | POA: Diagnosis not present

## 2019-02-27 DIAGNOSIS — Z8673 Personal history of transient ischemic attack (TIA), and cerebral infarction without residual deficits: Secondary | ICD-10-CM | POA: Diagnosis not present

## 2019-02-27 DIAGNOSIS — Z7902 Long term (current) use of antithrombotics/antiplatelets: Secondary | ICD-10-CM | POA: Diagnosis not present

## 2019-02-27 DIAGNOSIS — C182 Malignant neoplasm of ascending colon: Secondary | ICD-10-CM | POA: Diagnosis not present

## 2019-02-27 DIAGNOSIS — Z79899 Other long term (current) drug therapy: Secondary | ICD-10-CM | POA: Diagnosis not present

## 2019-02-27 DIAGNOSIS — E079 Disorder of thyroid, unspecified: Secondary | ICD-10-CM | POA: Diagnosis not present

## 2019-02-27 DIAGNOSIS — Z90710 Acquired absence of both cervix and uterus: Secondary | ICD-10-CM | POA: Diagnosis not present

## 2019-04-20 DIAGNOSIS — S72002A Fracture of unspecified part of neck of left femur, initial encounter for closed fracture: Secondary | ICD-10-CM | POA: Diagnosis not present

## 2019-04-20 DIAGNOSIS — S0990XA Unspecified injury of head, initial encounter: Secondary | ICD-10-CM | POA: Diagnosis not present

## 2019-04-20 DIAGNOSIS — Z8673 Personal history of transient ischemic attack (TIA), and cerebral infarction without residual deficits: Secondary | ICD-10-CM | POA: Diagnosis not present

## 2019-04-20 DIAGNOSIS — C189 Malignant neoplasm of colon, unspecified: Secondary | ICD-10-CM | POA: Diagnosis not present

## 2019-04-20 DIAGNOSIS — S72041A Displaced fracture of base of neck of right femur, initial encounter for closed fracture: Secondary | ICD-10-CM | POA: Diagnosis not present

## 2019-04-20 DIAGNOSIS — S299XXA Unspecified injury of thorax, initial encounter: Secondary | ICD-10-CM | POA: Diagnosis not present

## 2019-04-20 DIAGNOSIS — S72011A Unspecified intracapsular fracture of right femur, initial encounter for closed fracture: Secondary | ICD-10-CM | POA: Diagnosis not present

## 2019-04-20 DIAGNOSIS — E039 Hypothyroidism, unspecified: Secondary | ICD-10-CM | POA: Diagnosis present

## 2019-04-20 DIAGNOSIS — S72001A Fracture of unspecified part of neck of right femur, initial encounter for closed fracture: Secondary | ICD-10-CM | POA: Diagnosis present

## 2019-04-20 DIAGNOSIS — M1990 Unspecified osteoarthritis, unspecified site: Secondary | ICD-10-CM | POA: Diagnosis not present

## 2019-04-20 DIAGNOSIS — Z7902 Long term (current) use of antithrombotics/antiplatelets: Secondary | ICD-10-CM | POA: Diagnosis not present

## 2019-04-20 DIAGNOSIS — S72141A Displaced intertrochanteric fracture of right femur, initial encounter for closed fracture: Secondary | ICD-10-CM | POA: Diagnosis not present

## 2019-04-20 DIAGNOSIS — F418 Other specified anxiety disorders: Secondary | ICD-10-CM | POA: Diagnosis not present

## 2019-04-20 DIAGNOSIS — W19XXXA Unspecified fall, initial encounter: Secondary | ICD-10-CM | POA: Diagnosis not present

## 2019-04-24 DIAGNOSIS — S72001D Fracture of unspecified part of neck of right femur, subsequent encounter for closed fracture with routine healing: Secondary | ICD-10-CM | POA: Diagnosis not present

## 2019-04-24 DIAGNOSIS — Z8673 Personal history of transient ischemic attack (TIA), and cerebral infarction without residual deficits: Secondary | ICD-10-CM | POA: Diagnosis not present

## 2019-04-24 DIAGNOSIS — D649 Anemia, unspecified: Secondary | ICD-10-CM | POA: Diagnosis not present

## 2019-04-24 DIAGNOSIS — Z79899 Other long term (current) drug therapy: Secondary | ICD-10-CM | POA: Diagnosis not present

## 2019-04-24 DIAGNOSIS — Z483 Aftercare following surgery for neoplasm: Secondary | ICD-10-CM | POA: Diagnosis not present

## 2019-04-24 DIAGNOSIS — W19XXXD Unspecified fall, subsequent encounter: Secondary | ICD-10-CM | POA: Diagnosis not present

## 2019-04-24 DIAGNOSIS — Z85038 Personal history of other malignant neoplasm of large intestine: Secondary | ICD-10-CM | POA: Diagnosis not present

## 2019-04-24 DIAGNOSIS — E079 Disorder of thyroid, unspecified: Secondary | ICD-10-CM | POA: Diagnosis not present

## 2019-04-24 DIAGNOSIS — Z4789 Encounter for other orthopedic aftercare: Secondary | ICD-10-CM | POA: Diagnosis not present

## 2019-04-24 DIAGNOSIS — C182 Malignant neoplasm of ascending colon: Secondary | ICD-10-CM | POA: Diagnosis not present

## 2019-04-24 DIAGNOSIS — E039 Hypothyroidism, unspecified: Secondary | ICD-10-CM | POA: Diagnosis not present

## 2019-04-24 DIAGNOSIS — Z7902 Long term (current) use of antithrombotics/antiplatelets: Secondary | ICD-10-CM | POA: Diagnosis not present

## 2019-04-24 DIAGNOSIS — M1990 Unspecified osteoarthritis, unspecified site: Secondary | ICD-10-CM | POA: Diagnosis not present

## 2019-04-25 DIAGNOSIS — E039 Hypothyroidism, unspecified: Secondary | ICD-10-CM | POA: Diagnosis not present

## 2019-04-25 DIAGNOSIS — S72001D Fracture of unspecified part of neck of right femur, subsequent encounter for closed fracture with routine healing: Secondary | ICD-10-CM | POA: Diagnosis not present

## 2019-04-25 DIAGNOSIS — Z4789 Encounter for other orthopedic aftercare: Secondary | ICD-10-CM | POA: Diagnosis not present

## 2019-04-25 DIAGNOSIS — Z8673 Personal history of transient ischemic attack (TIA), and cerebral infarction without residual deficits: Secondary | ICD-10-CM | POA: Diagnosis not present

## 2019-04-25 DIAGNOSIS — M1990 Unspecified osteoarthritis, unspecified site: Secondary | ICD-10-CM | POA: Diagnosis not present

## 2019-04-25 DIAGNOSIS — Z79899 Other long term (current) drug therapy: Secondary | ICD-10-CM | POA: Diagnosis not present

## 2019-04-29 DIAGNOSIS — Z8673 Personal history of transient ischemic attack (TIA), and cerebral infarction without residual deficits: Secondary | ICD-10-CM | POA: Diagnosis not present

## 2019-04-29 DIAGNOSIS — Z79899 Other long term (current) drug therapy: Secondary | ICD-10-CM | POA: Diagnosis not present

## 2019-04-29 DIAGNOSIS — Z4789 Encounter for other orthopedic aftercare: Secondary | ICD-10-CM | POA: Diagnosis not present

## 2019-04-29 DIAGNOSIS — S72001D Fracture of unspecified part of neck of right femur, subsequent encounter for closed fracture with routine healing: Secondary | ICD-10-CM | POA: Diagnosis not present

## 2019-04-29 DIAGNOSIS — M1990 Unspecified osteoarthritis, unspecified site: Secondary | ICD-10-CM | POA: Diagnosis not present

## 2019-04-29 DIAGNOSIS — E039 Hypothyroidism, unspecified: Secondary | ICD-10-CM | POA: Diagnosis not present

## 2019-04-30 DIAGNOSIS — Z79899 Other long term (current) drug therapy: Secondary | ICD-10-CM | POA: Diagnosis not present

## 2019-04-30 DIAGNOSIS — Z4789 Encounter for other orthopedic aftercare: Secondary | ICD-10-CM | POA: Diagnosis not present

## 2019-04-30 DIAGNOSIS — E039 Hypothyroidism, unspecified: Secondary | ICD-10-CM | POA: Diagnosis not present

## 2019-04-30 DIAGNOSIS — M1990 Unspecified osteoarthritis, unspecified site: Secondary | ICD-10-CM | POA: Diagnosis not present

## 2019-04-30 DIAGNOSIS — S72001D Fracture of unspecified part of neck of right femur, subsequent encounter for closed fracture with routine healing: Secondary | ICD-10-CM | POA: Diagnosis not present

## 2019-04-30 DIAGNOSIS — Z8673 Personal history of transient ischemic attack (TIA), and cerebral infarction without residual deficits: Secondary | ICD-10-CM | POA: Diagnosis not present

## 2019-05-01 DIAGNOSIS — E782 Mixed hyperlipidemia: Secondary | ICD-10-CM | POA: Insufficient documentation

## 2019-05-01 DIAGNOSIS — E440 Moderate protein-calorie malnutrition: Secondary | ICD-10-CM | POA: Insufficient documentation

## 2019-05-01 DIAGNOSIS — E559 Vitamin D deficiency, unspecified: Secondary | ICD-10-CM | POA: Insufficient documentation

## 2019-05-01 DIAGNOSIS — I69323 Fluency disorder following cerebral infarction: Secondary | ICD-10-CM | POA: Insufficient documentation

## 2019-05-01 DIAGNOSIS — E039 Hypothyroidism, unspecified: Secondary | ICD-10-CM | POA: Insufficient documentation

## 2019-05-01 DIAGNOSIS — D500 Iron deficiency anemia secondary to blood loss (chronic): Secondary | ICD-10-CM | POA: Insufficient documentation

## 2019-05-02 ENCOUNTER — Other Ambulatory Visit: Payer: Self-pay

## 2019-05-02 ENCOUNTER — Encounter: Payer: Self-pay | Admitting: Legal Medicine

## 2019-05-02 ENCOUNTER — Ambulatory Visit (INDEPENDENT_AMBULATORY_CARE_PROVIDER_SITE_OTHER): Payer: Medicare Other | Admitting: Legal Medicine

## 2019-05-02 DIAGNOSIS — M8008XA Age-related osteoporosis with current pathological fracture, vertebra(e), initial encounter for fracture: Secondary | ICD-10-CM | POA: Diagnosis not present

## 2019-05-02 DIAGNOSIS — M80051A Age-related osteoporosis with current pathological fracture, right femur, initial encounter for fracture: Secondary | ICD-10-CM

## 2019-05-02 DIAGNOSIS — Z8781 Personal history of (healed) traumatic fracture: Secondary | ICD-10-CM | POA: Insufficient documentation

## 2019-05-02 HISTORY — DX: Personal history of (healed) traumatic fracture: Z87.81

## 2019-05-02 NOTE — Progress Notes (Signed)
Acute Office Visit  Subjective:    Patient ID: [name], female    DOB: 1934/10/02, 85 y.o.   MRN: [id number]  Chief Complaint  Patient presents with  . Transitions Of Care    03/23/2019 admission and discharge 04/24/2019  . Hip Injury    Right Hip   ------------------------------------------------------------------------------------  Transition of care  Patient is here for transition of care. She was recently seen at [facility] for fracture right hip on 04/20/2019. Treatment for this included ORIF She reports excellent compliance with treatment. She reports this condition is Improved.  ------------------------------------------------------------------------------------   HPI Patient is in today for  Transition of care from right hip fracture. She was admitted on 1.31/2020 for fall while playing with dog.  She was found to have and subcapital hip on right fracture and has ORIF by Dr. Samule Dry.  She was discharged on 04/23/2019.  She is getting home PT.  No complications   No past medical history on file.  No past surgical history on file.  No family history on file.  Social History   Socioeconomic History  . Marital status: Widowed    Spouse name: Not on file  . Number of children: Not on file  . Years of education: Not on file  . Highest education level: Not on file  Occupational History  . Not on file  Tobacco Use  . Smoking status: Never Smoker  . Smokeless tobacco: Never Used  Substance and Sexual Activity  . Alcohol use: Never    Alcohol/week: 0.0 standard drinks  . Drug use: Never  . Sexual activity: Not Currently  Other Topics Concern  . Not on file  Social History Narrative  . Not on file   Social Determinants of Health   Financial Resource Strain:   . Difficulty of Paying Living Expenses: Not on file  Food Insecurity:   . Worried About Charity fundraiser in the Last Year: Not on file  . Ran Out of Food in the Last Year: Not on file    Transportation Needs:   . Lack of Transportation (Medical): Not on file  . Lack of Transportation (Non-Medical): Not on file  Physical Activity:   . Days of Exercise per Week: Not on file  . Minutes of Exercise per Session: Not on file  Stress:   . Feeling of Stress : Not on file  Social Connections:   . Frequency of Communication with Friends and Family: Not on file  . Frequency of Social Gatherings with Friends and Family: Not on file  . Attends Religious Services: Not on file  . Active Member of Clubs or Organizations: Not on file  . Attends Archivist Meetings: Not on file  . Marital Status: Not on file  Intimate Partner Violence:   . Fear of Current or Ex-Partner: Not on file  . Emotionally Abused: Not on file  . Physically Abused: Not on file  . Sexually Abused: Not on file    Outpatient Medications Prior to Visit  Medication Sig Dispense Refill  . clopidogrel (PLAVIX) 75 MG tablet     . Fe Fum-FA-B Cmp-C-Zn-Mg-Mn-Cu (FERROCITE PLUS) 106-1 MG TABS     . levothyroxine (SYNTHROID) 125 MCG tablet Take 125 mcg by mouth daily.    . Vitamin D, Ergocalciferol, (DRISDOL) 50000 units CAPS capsule TAKE 1 CAPSULE EVERY 7 DAYS. 12 capsule 0  . XARELTO 10 MG TABS tablet Take 10 mg by mouth at bedtime.    . gabapentin (  NEURONTIN) 100 MG capsule Take 1 capsule (100 mg total) by mouth at bedtime. (Patient not taking: Reported on 05/02/2019) 30 capsule 3  . levothyroxine (SYNTHROID, LEVOTHROID) 88 MCG tablet      No facility-administered medications prior to visit.    No Known Allergies  Review of Systems  Constitutional: Negative.   HENT: Negative.   Eyes: Negative.   Respiratory: Negative.   Cardiovascular: Negative.   Gastrointestinal: Negative.   Genitourinary: Negative.   Musculoskeletal: Negative.   Neurological: Negative.        Objective:    Physical Exam Constitutional:      Appearance: Normal appearance.  HENT:     Head: Normocephalic.     Nose: Nose  normal.  Cardiovascular:     Rate and Rhythm: Normal rate and regular rhythm.     Pulses: Normal pulses.     Heart sounds: Normal heart sounds.  Pulmonary:     Effort: Pulmonary effort is normal.     Breath sounds: Normal breath sounds.  Musculoskeletal:        General: Normal range of motion.     Cervical back: Normal range of motion and neck supple.     Comments: Good healing of incision right hip  Neurological:     General: No focal deficit present.     Mental Status: She is alert and oriented to person, place, and time.     BP 90/60 (BP Location: Left Arm, Patient Position: Sitting)   Pulse 75   Temp 98.3 F (36.8 C) (Oral)   Resp 16   Ht 5' 2.6" (1.59 m)   Wt 122 lb (55.3 kg)   SpO2 98%   BMI 21.89 kg/m  Wt Readings from Last 3 Encounters:  05/02/19 122 lb (55.3 kg)  12/06/16 135 lb (61.2 kg)  12/30/14 140 lb (63.5 kg)    Health Maintenance Due  Topic Date Due  . TETANUS/TDAP  03/27/1955  . DEXA SCAN  03/26/2001  . PNA vac Low Risk Adult (1 of 2 - PCV13) 03/26/2001  . INFLUENZA VACCINE  10/19/2018    There are no preventive care reminders to display for this patient.   No results found for: TSH Lab Results  Component Value Date   WBC 8.2 03/17/2010   HGB 10.1 (L) 03/17/2010   HCT 30.4 (L) 03/17/2010   MCV 90.7 03/17/2010   PLT 164 03/17/2010   Lab Results  Component Value Date   NA 136 03/17/2010   K 3.6 03/17/2010   CO2 24 03/17/2010   GLUCOSE 126 (H) 03/17/2010   BUN 5 (L) 03/17/2010   CREATININE 0.64 03/17/2010   CALCIUM 8.7 03/17/2010   No results found for: CHOL No results found for: HDL No results found for: LDLCALC No results found for: TRIG No results found for: CHOLHDL No results found for: HGBA1C     Assessment & Plan:   Problem List Items Addressed This Visit      Musculoskeletal and Integument   Osteoporosis with fracture (Chronic)    AN INDIVIDUAL CARE PLAN was established and reinforced today.  The patient's status was  assessed using clinical findings on exam, labs, and other diagnostic testing. Patient's success at meeting treatment goals based on disease specific evidence-bassed guidelines and found to be in fair control. RECOMMENDATIONS include maintaining present medicines and treatment. She will need DEXA.        Other   S/P right hip fracture    AN INDIVIDUAL CARE PLAN was established and  reinforced today.  The patient's status was assessed using clinical findings on exam, labs, and other diagnostic testing. Patient's success at meeting treatment goals based on disease specific evidence-bassed guidelines and found to be in fair control. RECOMMENDATIONS include maintaining present medicines and treatment. Will need more PT      Relevant Orders   DG Bone Density    Other Visit Diagnoses    Age-related osteoporosis with current pathological fracture, vertebra(e), initial encounter for fracture ([place])        Relevant Orders   DG Bone Density       No orders of the defined types were placed in this encounter.    Reinaldo Meeker, MD

## 2019-05-03 DIAGNOSIS — M8080XA Other osteoporosis with current pathological fracture, unspecified site, initial encounter for fracture: Secondary | ICD-10-CM

## 2019-05-03 HISTORY — DX: Other osteoporosis with current pathological fracture, unspecified site, initial encounter for fracture: M80.80XA

## 2019-05-03 NOTE — Assessment & Plan Note (Signed)
AN INDIVIDUAL CARE PLAN was established and reinforced today.  The patient's status was assessed using clinical findings on exam, labs, and other diagnostic testing. Patient's success at meeting treatment goals based on disease specific evidence-bassed guidelines and found to be in fair control. RECOMMENDATIONS include maintaining present medicines and treatment. She will need DEXA.

## 2019-05-03 NOTE — Assessment & Plan Note (Signed)
AN INDIVIDUAL CARE PLAN was established and reinforced today.  The patient's status was assessed using clinical findings on exam, labs, and other diagnostic testing. Patient's success at meeting treatment goals based on disease specific evidence-bassed guidelines and found to be in fair control. RECOMMENDATIONS include maintaining present medicines and treatment. Will need more PT

## 2019-05-05 DIAGNOSIS — S72041A Displaced fracture of base of neck of right femur, initial encounter for closed fracture: Secondary | ICD-10-CM | POA: Diagnosis not present

## 2019-05-06 DIAGNOSIS — Z4789 Encounter for other orthopedic aftercare: Secondary | ICD-10-CM | POA: Diagnosis not present

## 2019-05-06 DIAGNOSIS — M1990 Unspecified osteoarthritis, unspecified site: Secondary | ICD-10-CM | POA: Diagnosis not present

## 2019-05-06 DIAGNOSIS — Z8673 Personal history of transient ischemic attack (TIA), and cerebral infarction without residual deficits: Secondary | ICD-10-CM | POA: Diagnosis not present

## 2019-05-06 DIAGNOSIS — S72001D Fracture of unspecified part of neck of right femur, subsequent encounter for closed fracture with routine healing: Secondary | ICD-10-CM | POA: Diagnosis not present

## 2019-05-06 DIAGNOSIS — E039 Hypothyroidism, unspecified: Secondary | ICD-10-CM | POA: Diagnosis not present

## 2019-05-06 DIAGNOSIS — Z79899 Other long term (current) drug therapy: Secondary | ICD-10-CM | POA: Diagnosis not present

## 2019-05-07 DIAGNOSIS — E039 Hypothyroidism, unspecified: Secondary | ICD-10-CM | POA: Diagnosis not present

## 2019-05-07 DIAGNOSIS — Z8673 Personal history of transient ischemic attack (TIA), and cerebral infarction without residual deficits: Secondary | ICD-10-CM | POA: Diagnosis not present

## 2019-05-07 DIAGNOSIS — M1990 Unspecified osteoarthritis, unspecified site: Secondary | ICD-10-CM | POA: Diagnosis not present

## 2019-05-07 DIAGNOSIS — Z79899 Other long term (current) drug therapy: Secondary | ICD-10-CM | POA: Diagnosis not present

## 2019-05-07 DIAGNOSIS — S72001D Fracture of unspecified part of neck of right femur, subsequent encounter for closed fracture with routine healing: Secondary | ICD-10-CM | POA: Diagnosis not present

## 2019-05-07 DIAGNOSIS — Z4789 Encounter for other orthopedic aftercare: Secondary | ICD-10-CM | POA: Diagnosis not present

## 2019-05-16 DIAGNOSIS — Z8673 Personal history of transient ischemic attack (TIA), and cerebral infarction without residual deficits: Secondary | ICD-10-CM | POA: Diagnosis not present

## 2019-05-16 DIAGNOSIS — Z4789 Encounter for other orthopedic aftercare: Secondary | ICD-10-CM | POA: Diagnosis not present

## 2019-05-16 DIAGNOSIS — Z79899 Other long term (current) drug therapy: Secondary | ICD-10-CM | POA: Diagnosis not present

## 2019-05-16 DIAGNOSIS — E039 Hypothyroidism, unspecified: Secondary | ICD-10-CM | POA: Diagnosis not present

## 2019-05-16 DIAGNOSIS — M1990 Unspecified osteoarthritis, unspecified site: Secondary | ICD-10-CM | POA: Diagnosis not present

## 2019-05-16 DIAGNOSIS — S72001D Fracture of unspecified part of neck of right femur, subsequent encounter for closed fracture with routine healing: Secondary | ICD-10-CM | POA: Diagnosis not present

## 2019-05-21 DIAGNOSIS — Z4789 Encounter for other orthopedic aftercare: Secondary | ICD-10-CM | POA: Diagnosis not present

## 2019-05-21 DIAGNOSIS — S72001D Fracture of unspecified part of neck of right femur, subsequent encounter for closed fracture with routine healing: Secondary | ICD-10-CM | POA: Diagnosis not present

## 2019-05-21 DIAGNOSIS — Z79899 Other long term (current) drug therapy: Secondary | ICD-10-CM | POA: Diagnosis not present

## 2019-05-21 DIAGNOSIS — Z8673 Personal history of transient ischemic attack (TIA), and cerebral infarction without residual deficits: Secondary | ICD-10-CM | POA: Diagnosis not present

## 2019-05-21 DIAGNOSIS — E039 Hypothyroidism, unspecified: Secondary | ICD-10-CM | POA: Diagnosis not present

## 2019-05-21 DIAGNOSIS — M1990 Unspecified osteoarthritis, unspecified site: Secondary | ICD-10-CM | POA: Diagnosis not present

## 2019-05-22 DIAGNOSIS — Z79899 Other long term (current) drug therapy: Secondary | ICD-10-CM | POA: Diagnosis not present

## 2019-05-22 DIAGNOSIS — M1990 Unspecified osteoarthritis, unspecified site: Secondary | ICD-10-CM | POA: Diagnosis not present

## 2019-05-22 DIAGNOSIS — E039 Hypothyroidism, unspecified: Secondary | ICD-10-CM | POA: Diagnosis not present

## 2019-05-22 DIAGNOSIS — S72001D Fracture of unspecified part of neck of right femur, subsequent encounter for closed fracture with routine healing: Secondary | ICD-10-CM | POA: Diagnosis not present

## 2019-05-22 DIAGNOSIS — Z4789 Encounter for other orthopedic aftercare: Secondary | ICD-10-CM | POA: Diagnosis not present

## 2019-05-22 DIAGNOSIS — Z8673 Personal history of transient ischemic attack (TIA), and cerebral infarction without residual deficits: Secondary | ICD-10-CM | POA: Diagnosis not present

## 2019-05-24 DIAGNOSIS — W19XXXD Unspecified fall, subsequent encounter: Secondary | ICD-10-CM | POA: Diagnosis not present

## 2019-05-24 DIAGNOSIS — M1990 Unspecified osteoarthritis, unspecified site: Secondary | ICD-10-CM | POA: Diagnosis not present

## 2019-05-24 DIAGNOSIS — Z79899 Other long term (current) drug therapy: Secondary | ICD-10-CM | POA: Diagnosis not present

## 2019-05-24 DIAGNOSIS — Z4789 Encounter for other orthopedic aftercare: Secondary | ICD-10-CM | POA: Diagnosis not present

## 2019-05-24 DIAGNOSIS — E039 Hypothyroidism, unspecified: Secondary | ICD-10-CM | POA: Diagnosis not present

## 2019-05-24 DIAGNOSIS — Z8673 Personal history of transient ischemic attack (TIA), and cerebral infarction without residual deficits: Secondary | ICD-10-CM | POA: Diagnosis not present

## 2019-05-24 DIAGNOSIS — Z7902 Long term (current) use of antithrombotics/antiplatelets: Secondary | ICD-10-CM | POA: Diagnosis not present

## 2019-05-24 DIAGNOSIS — S72001D Fracture of unspecified part of neck of right femur, subsequent encounter for closed fracture with routine healing: Secondary | ICD-10-CM | POA: Diagnosis not present

## 2019-05-26 DIAGNOSIS — Z79899 Other long term (current) drug therapy: Secondary | ICD-10-CM | POA: Diagnosis not present

## 2019-05-26 DIAGNOSIS — Z4789 Encounter for other orthopedic aftercare: Secondary | ICD-10-CM | POA: Diagnosis not present

## 2019-05-26 DIAGNOSIS — Z8673 Personal history of transient ischemic attack (TIA), and cerebral infarction without residual deficits: Secondary | ICD-10-CM | POA: Diagnosis not present

## 2019-05-26 DIAGNOSIS — E039 Hypothyroidism, unspecified: Secondary | ICD-10-CM | POA: Diagnosis not present

## 2019-05-26 DIAGNOSIS — S72001D Fracture of unspecified part of neck of right femur, subsequent encounter for closed fracture with routine healing: Secondary | ICD-10-CM | POA: Diagnosis not present

## 2019-05-26 DIAGNOSIS — M1990 Unspecified osteoarthritis, unspecified site: Secondary | ICD-10-CM | POA: Diagnosis not present

## 2019-06-03 DIAGNOSIS — Z79899 Other long term (current) drug therapy: Secondary | ICD-10-CM | POA: Diagnosis not present

## 2019-06-03 DIAGNOSIS — Z8673 Personal history of transient ischemic attack (TIA), and cerebral infarction without residual deficits: Secondary | ICD-10-CM | POA: Diagnosis not present

## 2019-06-03 DIAGNOSIS — E039 Hypothyroidism, unspecified: Secondary | ICD-10-CM | POA: Diagnosis not present

## 2019-06-03 DIAGNOSIS — S72001D Fracture of unspecified part of neck of right femur, subsequent encounter for closed fracture with routine healing: Secondary | ICD-10-CM | POA: Diagnosis not present

## 2019-06-03 DIAGNOSIS — Z4789 Encounter for other orthopedic aftercare: Secondary | ICD-10-CM | POA: Diagnosis not present

## 2019-06-03 DIAGNOSIS — M1990 Unspecified osteoarthritis, unspecified site: Secondary | ICD-10-CM | POA: Diagnosis not present

## 2019-06-04 DIAGNOSIS — Z4789 Encounter for other orthopedic aftercare: Secondary | ICD-10-CM | POA: Diagnosis not present

## 2019-06-04 DIAGNOSIS — Z8673 Personal history of transient ischemic attack (TIA), and cerebral infarction without residual deficits: Secondary | ICD-10-CM | POA: Diagnosis not present

## 2019-06-04 DIAGNOSIS — Z79899 Other long term (current) drug therapy: Secondary | ICD-10-CM | POA: Diagnosis not present

## 2019-06-04 DIAGNOSIS — S72001D Fracture of unspecified part of neck of right femur, subsequent encounter for closed fracture with routine healing: Secondary | ICD-10-CM | POA: Diagnosis not present

## 2019-06-04 DIAGNOSIS — M1990 Unspecified osteoarthritis, unspecified site: Secondary | ICD-10-CM | POA: Diagnosis not present

## 2019-06-04 DIAGNOSIS — E039 Hypothyroidism, unspecified: Secondary | ICD-10-CM | POA: Diagnosis not present

## 2019-06-05 DIAGNOSIS — S72041A Displaced fracture of base of neck of right femur, initial encounter for closed fracture: Secondary | ICD-10-CM | POA: Diagnosis not present

## 2019-06-11 DIAGNOSIS — E039 Hypothyroidism, unspecified: Secondary | ICD-10-CM | POA: Diagnosis not present

## 2019-06-11 DIAGNOSIS — S72001D Fracture of unspecified part of neck of right femur, subsequent encounter for closed fracture with routine healing: Secondary | ICD-10-CM | POA: Diagnosis not present

## 2019-06-11 DIAGNOSIS — Z8673 Personal history of transient ischemic attack (TIA), and cerebral infarction without residual deficits: Secondary | ICD-10-CM | POA: Diagnosis not present

## 2019-06-11 DIAGNOSIS — M1990 Unspecified osteoarthritis, unspecified site: Secondary | ICD-10-CM | POA: Diagnosis not present

## 2019-06-11 DIAGNOSIS — Z4789 Encounter for other orthopedic aftercare: Secondary | ICD-10-CM | POA: Diagnosis not present

## 2019-06-11 DIAGNOSIS — Z79899 Other long term (current) drug therapy: Secondary | ICD-10-CM | POA: Diagnosis not present

## 2019-06-18 DIAGNOSIS — Z8673 Personal history of transient ischemic attack (TIA), and cerebral infarction without residual deficits: Secondary | ICD-10-CM | POA: Diagnosis not present

## 2019-06-18 DIAGNOSIS — Z4789 Encounter for other orthopedic aftercare: Secondary | ICD-10-CM | POA: Diagnosis not present

## 2019-06-18 DIAGNOSIS — Z79899 Other long term (current) drug therapy: Secondary | ICD-10-CM | POA: Diagnosis not present

## 2019-06-18 DIAGNOSIS — M1990 Unspecified osteoarthritis, unspecified site: Secondary | ICD-10-CM | POA: Diagnosis not present

## 2019-06-18 DIAGNOSIS — S72001D Fracture of unspecified part of neck of right femur, subsequent encounter for closed fracture with routine healing: Secondary | ICD-10-CM | POA: Diagnosis not present

## 2019-06-18 DIAGNOSIS — E039 Hypothyroidism, unspecified: Secondary | ICD-10-CM | POA: Diagnosis not present

## 2019-06-25 DIAGNOSIS — Z79899 Other long term (current) drug therapy: Secondary | ICD-10-CM | POA: Diagnosis not present

## 2019-06-25 DIAGNOSIS — Z85038 Personal history of other malignant neoplasm of large intestine: Secondary | ICD-10-CM | POA: Diagnosis not present

## 2019-06-25 DIAGNOSIS — C182 Malignant neoplasm of ascending colon: Secondary | ICD-10-CM | POA: Diagnosis not present

## 2019-06-25 LAB — LAB REPORT - SCANNED
ALT: 10 (ref 3–30)
AST: 28
BUN: 6
CEA: 1.6
Creatinine, Ser: 0.6
Glucose, Bld: 100 — AB (ref 70–99)
Potassium: 3.9
Sodium: 141
TSH: 5

## 2019-06-30 ENCOUNTER — Other Ambulatory Visit: Payer: Self-pay

## 2019-06-30 ENCOUNTER — Other Ambulatory Visit: Payer: Self-pay | Admitting: Legal Medicine

## 2019-06-30 MED ORDER — LEVOTHYROXINE SODIUM 100 MCG PO TABS: 100.0000 ug | ORAL_TABLET | Freq: Every day | ORAL | 1 refills | Status: DC

## 2019-06-30 MED ORDER — CLOPIDOGREL BISULFATE 75 MG PO TABS: 75.0000 mg | ORAL_TABLET | Freq: Every day | ORAL | 1 refills | Status: DC

## 2019-07-01 ENCOUNTER — Encounter: Payer: Self-pay | Admitting: Legal Medicine

## 2019-07-09 DIAGNOSIS — M8589 Other specified disorders of bone density and structure, multiple sites: Secondary | ICD-10-CM | POA: Diagnosis not present

## 2019-07-09 DIAGNOSIS — M810 Age-related osteoporosis without current pathological fracture: Secondary | ICD-10-CM | POA: Diagnosis not present

## 2019-07-21 DIAGNOSIS — S72041A Displaced fracture of base of neck of right femur, initial encounter for closed fracture: Secondary | ICD-10-CM | POA: Diagnosis not present

## 2019-09-03 ENCOUNTER — Ambulatory Visit (INDEPENDENT_AMBULATORY_CARE_PROVIDER_SITE_OTHER): Payer: Medicare Other | Admitting: Legal Medicine

## 2019-09-03 ENCOUNTER — Other Ambulatory Visit: Payer: Self-pay

## 2019-09-03 ENCOUNTER — Encounter: Payer: Self-pay | Admitting: Legal Medicine

## 2019-09-03 VITALS — BP 140/80 | HR 70 | Temp 97.5°F | Resp 98 | Ht 65.0 in | Wt 125.0 lb

## 2019-09-03 DIAGNOSIS — E039 Hypothyroidism, unspecified: Secondary | ICD-10-CM | POA: Insufficient documentation

## 2019-09-03 DIAGNOSIS — C182 Malignant neoplasm of ascending colon: Secondary | ICD-10-CM | POA: Diagnosis not present

## 2019-09-03 DIAGNOSIS — E038 Other specified hypothyroidism: Secondary | ICD-10-CM

## 2019-09-03 DIAGNOSIS — I69323 Fluency disorder following cerebral infarction: Secondary | ICD-10-CM

## 2019-09-03 DIAGNOSIS — M8000XD Age-related osteoporosis with current pathological fracture, unspecified site, subsequent encounter for fracture with routine healing: Secondary | ICD-10-CM

## 2019-09-03 DIAGNOSIS — M810 Age-related osteoporosis without current pathological fracture: Secondary | ICD-10-CM | POA: Insufficient documentation

## 2019-09-03 DIAGNOSIS — E782 Mixed hyperlipidemia: Secondary | ICD-10-CM | POA: Diagnosis not present

## 2019-09-03 DIAGNOSIS — Z6820 Body mass index (BMI) 20.0-20.9, adult: Secondary | ICD-10-CM

## 2019-09-03 DIAGNOSIS — E440 Moderate protein-calorie malnutrition: Secondary | ICD-10-CM | POA: Insufficient documentation

## 2019-09-03 NOTE — Assessment & Plan Note (Signed)
Supplement nutrition with protein/calorie supplement with meals to improve nutritional status.

## 2019-09-03 NOTE — Progress Notes (Signed)
Established Patient Office Visit  Subjective:  Patient ID: [name], female    DOB: Jun 08, 1934  Age: 85 y.o. MRN: [id number]  CC:  Chief Complaint  Patient presents with  . Hyperlipidemia  . Hypothyroidism    HPI  Tawanna Solo presents for chronic visit.  Patient presents for follow up of hypertension.  Patient tolerating diet and exercise well with side effects.  Patient was diagnosed with hypertension 2010 so has been treated for hypertension for 10 years.Patient is working on maintaining diet and exercise regimen and follows up as directed. Complication include none.  Patient presents with hyperlipidemia.  Compliance with treatment has been good; patient takes medicines as directed, maintains low cholesterol diet, follows up as directed, and maintains exercise regimen.  Patient is using diet without problems.  She has fracture right hip. And still has pain with osteoporosis. She sees Dr. Hinton Rao for colon cancer.  Past Medical History:  Diagnosis Date  . Hypothyroidism   . Osteoporosis with fracture 05/03/2019  . S/P right hip fracture 05/02/2019    Past Surgical History:  Procedure Laterality Date  . ABDOMINAL HYSTERECTOMY    . APPENDECTOMY    . COLON SURGERY  2020  . REPLACEMENT TOTAL KNEE BILATERAL    . TONSILLECTOMY      History reviewed. No pertinent family history.  Social History   Socioeconomic History  . Marital status: Widowed    Spouse name: Not on file  . Number of children: Not on file  . Years of education: Not on file  . Highest education level: Not on file  Occupational History  . Not on file  Tobacco Use  . Smoking status: Never Smoker  . Smokeless tobacco: Never Used  Substance and Sexual Activity  . Alcohol use: Never    Alcohol/week: 0.0 standard drinks  . Drug use: Never  . Sexual activity: Not Currently  Other Topics Concern  . Not on file  Social History Narrative  . Not on file   Social Determinants of Health    Financial Resource Strain:   . Difficulty of Paying Living Expenses:   Food Insecurity:   . Worried About Charity fundraiser in the Last Year:   . Arboriculturist in the Last Year:   Transportation Needs:   . Film/video editor (Medical):   Marland Kitchen Lack of Transportation (Non-Medical):   Physical Activity:   . Days of Exercise per Week:   . Minutes of Exercise per Session:   Stress:   . Feeling of Stress :   Social Connections:   . Frequency of Communication with Friends and Family:   . Frequency of Social Gatherings with Friends and Family:   . Attends Religious Services:   . Active Member of Clubs or Organizations:   . Attends Archivist Meetings:   Marland Kitchen Marital Status:   Intimate Partner Violence:   . Fear of Current or Ex-Partner:   . Emotionally Abused:   Marland Kitchen Physically Abused:   . Sexually Abused:     Outpatient Medications Prior to Visit  Medication Sig Dispense Refill  . clopidogrel (PLAVIX) 75 MG tablet Take 1 tablet (75 mg total) by mouth daily. 90 tablet 1  . levothyroxine (SYNTHROID) 100 MCG tablet Take 1 tablet (100 mcg total) by mouth daily before breakfast. 90 tablet 1  . Fe Fum-FA-B Cmp-C-Zn-Mg-Mn-Cu (FERROCITE PLUS) 106-1 MG TABS     . gabapentin (NEURONTIN) 100 MG capsule Take 1 capsule (100 mg total)  by mouth at bedtime. (Patient not taking: Reported on 05/02/2019) 30 capsule 3  . Vitamin D, Ergocalciferol, (DRISDOL) 50000 units CAPS capsule TAKE 1 CAPSULE EVERY 7 DAYS. 12 capsule 0  . XARELTO 10 MG TABS tablet Take 10 mg by mouth at bedtime.     No facility-administered medications prior to visit.    No Known Allergies  ROS Review of Systems  Constitutional: Negative.   HENT: Negative.   Eyes: Negative.   Respiratory: Negative.   Cardiovascular: Negative.   Gastrointestinal: Negative.   Endocrine: Negative.   Musculoskeletal: Positive for arthralgias.  Skin: Negative.   Neurological: Negative.   Psychiatric/Behavioral: Negative.        Objective:    Physical Exam Vitals reviewed.  Constitutional:      Appearance: Normal appearance.  HENT:     Head: Normocephalic and atraumatic.     Right Ear: Tympanic membrane, ear canal and external ear normal.     Left Ear: Tympanic membrane, ear canal and external ear normal.     Nose: Nose normal.     Mouth/Throat:     Mouth: Mucous membranes are moist.  Eyes:     Extraocular Movements: Extraocular movements intact.     Conjunctiva/sclera: Conjunctivae normal.     Pupils: Pupils are equal, round, and reactive to light.  Cardiovascular:     Rate and Rhythm: Normal rate and regular rhythm.     Pulses: Normal pulses.     Heart sounds: Normal heart sounds.  Pulmonary:     Breath sounds: Normal breath sounds.  Abdominal:     General: Abdomen is flat. Bowel sounds are normal.     Palpations: Abdomen is soft.  Musculoskeletal:        General: Normal range of motion.     Cervical back: Normal range of motion and neck supple.  Skin:    General: Skin is warm and dry.     Capillary Refill: Capillary refill takes less than 2 seconds.  Neurological:     General: No focal deficit present.     Mental Status: She is alert and oriented to person, place, and time.  Psychiatric:        Mood and Affect: Mood normal.        Behavior: Behavior normal.        Thought Content: Thought content normal.        Judgment: Judgment normal.     BP 140/80   Pulse 70   Temp (!) 97.5 F (36.4 C) (Temporal)   Resp (!) 98   Ht 5\' 5"  (1.651 m)   Wt 125 lb (56.7 kg)   BMI 20.80 kg/m  Wt Readings from Last 3 Encounters:  09/03/19 125 lb (56.7 kg)  05/02/19 122 lb (55.3 kg)  12/06/16 135 lb (61.2 kg)     Health Maintenance Due  Topic Date Due  . COVID-19 Vaccine (1) Never done  . TETANUS/TDAP  Never done  . DEXA SCAN  Never done  . PNA vac Low Risk Adult (1 of 2 - PCV13) Never done    There are no preventive care reminders to display for this patient.  Lab Results  Component  Value Date   TSH 5.00 06/25/2019   Lab Results  Component Value Date   WBC 8.2 03/17/2010   HGB 10.1 (L) 03/17/2010   HCT 30.4 (L) 03/17/2010   MCV 90.7 03/17/2010   PLT 164 03/17/2010   Lab Results  Component Value Date   NA 141  06/25/2019   K 3.9 06/25/2019   CO2 24 03/17/2010   GLUCOSE 100 (A) 06/25/2019   BUN 6 06/25/2019   CREATININE 0.60 06/25/2019   AST 28 06/25/2019   ALT 10 06/25/2019   CALCIUM 8.7 03/17/2010   No results found for: CHOL No results found for: HDL No results found for: LDLCALC No results found for: TRIG No results found for: CHOLHDL No results found for: HGBA1C    Assessment & Plan:   Malignant neoplasm of colon: AN INDIVIDUAL CARE PLAN was established and reinforced today.  The patient's status was assessed using clinical findings on exam, labs, and other diagnostic testing. Patient's success at meeting treatment goals based on disease specific evidence-bassed guidelines and found to be in good control. RECOMMENDATIONS include continue follow ups with Dr. Hinton Rao present medicines and treatment.  Hypothyroidism: Patient is known to have hypothyroidism and is on treatment with levothyroxine 14mcg.  Patient was diagnosed 10 years ago.  Other treatment includes none.  Patient is compliant with medicines and last TSH 6 months ago.  Last TSH was normal..  Osteoporosis: AN INDIVIDUAL CARE PLAN for osteoporosis with stress fracture was established and reinforced today.  The patient's status was assessed using clinical findings on exam, labs, and other diagnostic testing. Patient's success at meeting treatment goals based on disease specific evidence-bassed guidelines and found to be in good control. RECOMMENDATIONS include maintaining present medicines and treatment. She will need DEXA  Fluency disorder following cerebral infarction: Patient was evaluated using information from exam, tests and other diagnostic studies to perform evidence-based  treatment for this disorder.  Opimizing treatment and improvement of neurologic deficits.  Patient is using cane to maintain as much independence as possible. Patient using cane.  Patient has full ability to perform ADLs. Her verba fluency is interrupted at times.  Hyperlipidemia: AN INDIVIDUAL CARE PLAN for hyperlipidemia/ cholesterol was established and reinforced today.  The patient's status was assessed using clinical findings on exam, lab and other diagnostic tests. The patient's disease status was assessed based on evidence-based guidelines and found to be well controlled. MEDICATIONS were reviewed. SELF MANAGEMENT GOALS have been discussed and patient's success at attaining the goal of low cholesterol was assessed. RECOMMENDATION given include regular exercise 3 days a week and low cholesterol/low fat diet. CLINICAL SUMMARY including written plan to identify barriers unique to the patient due to social or economic  reasons was discussed.  BMI 20.0: Continue present activity we discussed adding protein supplements. No orders of the defined types were placed in this encounter.  Moderate malnutrition: Supplement nutrition with protein/calorie supplement with meals to improve nutritional status.  Follow-up: Return in about 6 months (around 03/04/2020), or MCR pe kim, for fasting.    Reinaldo Meeker, MD

## 2019-09-03 NOTE — Assessment & Plan Note (Signed)
AN INDIVIDUAL CARE PLAN for osteoporosis with stress fracture was established and reinforced today.  The patient's status was assessed using clinical findings on exam, labs, and other diagnostic testing. Patient's success at meeting treatment goals based on disease specific evidence-bassed guidelines and found to be in good control. RECOMMENDATIONS include maintaining present medicines and treatment. She will need DEXA

## 2019-09-03 NOTE — Assessment & Plan Note (Signed)

## 2019-09-03 NOTE — Assessment & Plan Note (Signed)
AN INDIVIDUAL CARE PLAN was established and reinforced today.  The patient's status was assessed using clinical findings on exam, labs, and other diagnostic testing. Patient's success at meeting treatment goals based on disease specific evidence-bassed guidelines and found to be in good control. RECOMMENDATIONS include continue follow ups with Dr. Hinton Rao present medicines and treatment.

## 2019-09-03 NOTE — Assessment & Plan Note (Signed)
Patient was evaluated using information from exam, tests and other diagnostic studies to perform evidence-based treatment for this disorder.  Opimizing treatment and improvement of neurologic deficits.  Patient is using cane to maintain as much independence as possible. Patient using cane.  Patient has full ability to perform ADLs. Her verba fluency is interrupted at times.

## 2019-09-03 NOTE — Assessment & Plan Note (Signed)
Continue present activity we discussed adding protein supplements.

## 2019-09-03 NOTE — Assessment & Plan Note (Signed)
Patient is known to have hypothyroidism and is on treatment with levothyroxine 171mcg.  Patient was diagnosed 10 years ago.  Other treatment includes none.  Patient is compliant with medicines and last TSH 6 months ago.  Last TSH was normal..

## 2019-09-04 ENCOUNTER — Other Ambulatory Visit: Payer: Self-pay | Admitting: Legal Medicine

## 2019-09-04 DIAGNOSIS — E038 Other specified hypothyroidism: Secondary | ICD-10-CM

## 2019-09-04 LAB — COMPREHENSIVE METABOLIC PANEL
ALT: 7 IU/L (ref 0–32)
AST: 15 IU/L (ref 0–40)
Albumin/Globulin Ratio: 1.6 (ref 1.2–2.2)
Albumin: 4.2 g/dL (ref 3.6–4.6)
Alkaline Phosphatase: 117 IU/L (ref 48–121)
BUN/Creatinine Ratio: 12 (ref 12–28)
BUN: 8 mg/dL (ref 8–27)
Bilirubin Total: 0.4 mg/dL (ref 0.0–1.2)
CO2: 22 mmol/L (ref 20–29)
Calcium: 9.8 mg/dL (ref 8.7–10.3)
Chloride: 106 mmol/L (ref 96–106)
Creatinine, Ser: 0.67 mg/dL (ref 0.57–1.00)
GFR calc Af Amer: 94 mL/min/{1.73_m2} (ref >59)
GFR calc non Af Amer: 82 mL/min/{1.73_m2} (ref >59)
Globulin, Total: 2.6 g/dL (ref 1.5–4.5)
Glucose: 94 mg/dL (ref 65–99)
Potassium: 4.9 mmol/L (ref 3.5–5.2)
Sodium: 143 mmol/L (ref 134–144)
Total Protein: 6.8 g/dL (ref 6.0–8.5)

## 2019-09-04 LAB — CBC WITH DIFFERENTIAL/PLATELET
Basophils Absolute: 0.1 10*3/uL (ref 0.0–0.2)
Basos: 1 %
EOS (ABSOLUTE): 0.2 10*3/uL (ref 0.0–0.4)
Eos: 3 %
Hematocrit: 40.3 % (ref 34.0–46.6)
Hemoglobin: 13.4 g/dL (ref 11.1–15.9)
Immature Grans (Abs): 0 10*3/uL (ref 0.0–0.1)
Immature Granulocytes: 1 %
Lymphocytes Absolute: 1.8 10*3/uL (ref 0.7–3.1)
Lymphs: 28 %
MCH: 29.6 pg (ref 26.6–33.0)
MCHC: 33.3 g/dL (ref 31.5–35.7)
MCV: 89 fL (ref 79–97)
Monocytes Absolute: 0.7 10*3/uL (ref 0.1–0.9)
Monocytes: 11 %
Neutrophils Absolute: 3.6 10*3/uL (ref 1.4–7.0)
Neutrophils: 56 %
Platelets: 288 10*3/uL (ref 150–450)
RBC: 4.52 x10E6/uL (ref 3.77–5.28)
RDW: 13 % (ref 11.7–15.4)
WBC: 6.3 10*3/uL (ref 3.4–10.8)

## 2019-09-04 LAB — TSH: TSH: 0.14 u[IU]/mL — ABNORMAL LOW (ref 0.450–4.500)

## 2019-09-04 LAB — LIPID PANEL
Chol/HDL Ratio: 3.9 ratio (ref 0.0–4.4)
Cholesterol, Total: 269 mg/dL — ABNORMAL HIGH (ref 100–199)
HDL: 69 mg/dL (ref >39)
LDL Chol Calc (NIH): 179 mg/dL — ABNORMAL HIGH (ref 0–99)
Triglycerides: 119 mg/dL (ref 0–149)
VLDL Cholesterol Cal: 21 mg/dL (ref 5–40)

## 2019-09-04 LAB — CARDIOVASCULAR RISK ASSESSMENT

## 2019-09-04 MED ORDER — LEVOTHYROXINE SODIUM 88 MCG PO TABS: 88.0000 ug | ORAL_TABLET | Freq: Every day | ORAL | 3 refills | Status: DC

## 2019-09-04 NOTE — Progress Notes (Signed)
Still chronic anemia, glucose 100, kidney tests normal, liver tests normal, cholesterol high 179, TSH low 0.14 lower thyroid medicine to 88 mcg and retest in 2 months. lp

## 2019-09-27 DIAGNOSIS — M47816 Spondylosis without myelopathy or radiculopathy, lumbar region: Secondary | ICD-10-CM | POA: Diagnosis not present

## 2019-09-27 DIAGNOSIS — I708 Atherosclerosis of other arteries: Secondary | ICD-10-CM | POA: Diagnosis not present

## 2019-09-27 DIAGNOSIS — M160 Bilateral primary osteoarthritis of hip: Secondary | ICD-10-CM | POA: Diagnosis not present

## 2019-09-27 DIAGNOSIS — S32512A Fracture of superior rim of left pubis, initial encounter for closed fracture: Secondary | ICD-10-CM | POA: Diagnosis not present

## 2019-10-07 ENCOUNTER — Other Ambulatory Visit: Payer: Self-pay

## 2019-10-07 DIAGNOSIS — E038 Other specified hypothyroidism: Secondary | ICD-10-CM

## 2019-10-07 MED ORDER — LEVOTHYROXINE SODIUM 88 MCG PO TABS: 88.0000 ug | ORAL_TABLET | Freq: Every day | ORAL | 2 refills | Status: DC

## 2019-10-22 DIAGNOSIS — S72001A Fracture of unspecified part of neck of right femur, initial encounter for closed fracture: Secondary | ICD-10-CM | POA: Diagnosis not present

## 2019-10-22 DIAGNOSIS — S32592A Other specified fracture of left pubis, initial encounter for closed fracture: Secondary | ICD-10-CM | POA: Diagnosis not present

## 2019-11-13 DIAGNOSIS — Z85038 Personal history of other malignant neoplasm of large intestine: Secondary | ICD-10-CM | POA: Diagnosis not present

## 2019-11-13 DIAGNOSIS — M810 Age-related osteoporosis without current pathological fracture: Secondary | ICD-10-CM | POA: Diagnosis not present

## 2019-11-13 DIAGNOSIS — R5383 Other fatigue: Secondary | ICD-10-CM | POA: Diagnosis not present

## 2019-11-13 DIAGNOSIS — M7989 Other specified soft tissue disorders: Secondary | ICD-10-CM | POA: Diagnosis not present

## 2019-11-13 DIAGNOSIS — M79605 Pain in left leg: Secondary | ICD-10-CM | POA: Diagnosis not present

## 2019-11-13 DIAGNOSIS — R600 Localized edema: Secondary | ICD-10-CM | POA: Diagnosis not present

## 2019-11-13 DIAGNOSIS — M79662 Pain in left lower leg: Secondary | ICD-10-CM | POA: Diagnosis not present

## 2019-11-13 DIAGNOSIS — R970 Elevated carcinoembryonic antigen [CEA]: Secondary | ICD-10-CM | POA: Diagnosis not present

## 2019-12-25 DIAGNOSIS — D500 Iron deficiency anemia secondary to blood loss (chronic): Secondary | ICD-10-CM | POA: Diagnosis not present

## 2019-12-25 DIAGNOSIS — C182 Malignant neoplasm of ascending colon: Secondary | ICD-10-CM | POA: Diagnosis not present

## 2020-01-27 DIAGNOSIS — H1045 Other chronic allergic conjunctivitis: Secondary | ICD-10-CM | POA: Diagnosis not present

## 2020-03-03 DIAGNOSIS — S32592A Other specified fracture of left pubis, initial encounter for closed fracture: Secondary | ICD-10-CM | POA: Diagnosis not present

## 2020-03-03 DIAGNOSIS — S72001A Fracture of unspecified part of neck of right femur, initial encounter for closed fracture: Secondary | ICD-10-CM | POA: Diagnosis not present

## 2020-03-17 ENCOUNTER — Other Ambulatory Visit: Payer: Self-pay | Admitting: Hematology and Oncology

## 2020-03-17 DIAGNOSIS — C182 Malignant neoplasm of ascending colon: Secondary | ICD-10-CM

## 2020-03-22 ENCOUNTER — Telehealth: Payer: Self-pay | Admitting: Oncology

## 2020-03-22 NOTE — Telephone Encounter (Signed)
03/22/20 spoke with patient about appt

## 2020-03-25 NOTE — Progress Notes (Signed)
[facility]  [address] [place],  [place]  [id number] [phone]  Clinic Day:  03/29/2020  Referring physician: [name],*   This document serves as a record of services personally performed by Hosie Poisson, MD. It was created on their behalf by [facility], a trained medical scribe. The creation of this record is based on the scribe's personal observations and the provider's statements to them.   CHIEF COMPLAINT:  CC: Stage IIIB colon cancer  Current Treatment:  Surveillance   HISTORY OF PRESENT ILLNESS:  [name] is a 86 y.o. female with stage IIIB (T3 N1b M0) colon cancer diagnosed in November 2019.  She had never had a colonoscopy before, but had unintentional weight loss of 35 lb over the last year with decreased appetite.  A stool Hemoccult was positive.  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  The right colon revealed a sessile serrated polyp with an area of low-grade dysplasia as well as a tubular adenoma and small fragments of adenocarcinoma.  The left colon also revealed tubular adenomas and a benign hyperplastic polyp.  Small bowel biopsies were benign.  CT scan showed a few tiny subcentimeter lesions in the left hepatic lobe of low-attenuation which appear most consistent with cysts.  There were multiple multiple small gallstones and a small benign appearing left renal cyst.  However, she had an annular soft tissue mass of the ascending colon with several subcentimeter pericolonic nodes up to 9 mm in diameter which were suspicious for metastatic disease.  She was referred to Dr. Noberto Retort and underwent laparoscopic right hemicolectomy and primary ileocolostomy, as well as a cholecystectomy, in December 2019.  The final pathology revealed 2 separate tumors, 1 measuring 9.5 cm and the other 0.4 cm.  This was a moderately differentiated invasive colorectal  adenocarcinoma with mucinous features.  It extended into the pericolonic connective tissue and the smaller tumor extended into the muscularis propria.  There were some signet ring features and 3/35 nodes were positive for metastasis.  There was lymphovascular invasion.  Margins were clear.  The gallbladder revealed cholelithiasis and chronic cholecystitis.  Postop CEA was 8.3, but it was normal by January 2020.  I recommended adjuvant chemotherapy.  She wanted to take oral chemotherapy, so was placed on capecitabine 1000 mg twice daily in February 2020.  Due to toxicities, we eventually lowered her dose to 1000 mg in the morning and 500 mg in the afternoon.  The iron pills made her diarrhea worse, so that was discontinued.  She completed 6 cycles of capecitabine in June 2020.  She underwent colonoscopy to ileocolic anastomosis in December 2020 with Dr. Noberto Retort. which was negative.  She fell and fractured her right hip at the end of January 2021, and underwent surgery with Dr. Samule Dry on February 1st, 2021.  We saw her for routine follow-up in April and recommended a bone density scan.  This revealed osteoporosis with a T-score of -2 5 in the left femur, as well as a T-score -1.8 the spine.  We discussed treatment options with the patient via telephone and she opted for yearly IV Reclast, which we had planned to give her at her routine follow-up in July.  She was instructed to start calcium and vitamin-D as well.  She presented to emergency department due to another fall and CT imaging there was a minimally displaced fracture involving the left pubic body, extending into the superior  ramus, with an additional fracture of the left pubic root as well, extending into the superior pubic ramus.    INTERVAL HISTORY:  [name] is here for routine follow up and states that she has been fairly well.  She continues to have episodes of diarrhea and urgency since her surgery.  Blood counts and chemistries are unremarkable.  Her   appetite is good, and she has gained 7 and 1/2 pounds since her last visit.  She denies fever, chills or other signs of infection.  She denies nausea, vomiting, bowel issues, or abdominal pain.  She denies sore throat, cough, dyspnea, or chest pain.  REVIEW OF SYSTEMS:  Review of Systems  Constitutional: Negative.   HENT:  Negative.   Eyes: Negative.   Respiratory: Negative.   Cardiovascular: Negative.   Gastrointestinal: Positive for diarrhea (on occasion with urgency, this has occurred ever since her surgery). Negative for abdominal pain, nausea and vomiting.  Endocrine: Negative.   Genitourinary: Negative.    Musculoskeletal: Positive for gait problem (unsteady, uses a walker to ambulate).  Skin: Negative.   Neurological: Positive for gait problem (unsteady, uses a walker to ambulate).  Hematological: Negative.   Psychiatric/Behavioral: Negative.      VITALS:  Blood pressure (!) 183/79, pulse 72, temperature 97.8 F (36.6 C), temperature source Oral, resp. rate 18, height 5' 5.5" (1.664 m), weight 128 lb 12.8 oz (58.4 kg), SpO2 97 %.  Wt Readings from Last 3 Encounters:  03/29/20 128 lb 12.8 oz (58.4 kg)  09/03/19 125 lb (56.7 kg)  05/02/19 122 lb (55.3 kg)    Body mass index is 21.11 kg/m.  Performance status (ECOG): 1 - Symptomatic but completely ambulatory  PHYSICAL EXAM:  Physical Exam Constitutional:      General: She is not in acute distress.    Appearance: Normal appearance. She is normal weight.  HENT:     Head: Normocephalic and atraumatic.  Eyes:     General: No scleral icterus.    Extraocular Movements: Extraocular movements intact.     Conjunctiva/sclera: Conjunctivae normal.     Pupils: Pupils are equal, round, and reactive to light.  Cardiovascular:     Rate and Rhythm: Normal rate and regular rhythm.     Pulses: Normal pulses.     Heart sounds: Normal heart sounds. No murmur heard. No friction rub. No gallop.   Pulmonary:     Effort: Pulmonary  effort is normal. No respiratory distress.     Breath sounds: Normal breath sounds.  Abdominal:     General: Bowel sounds are normal. There is no distension.     Palpations: Abdomen is soft. There is no mass.     Tenderness: There is no abdominal tenderness.  Musculoskeletal:        General: Normal range of motion.     Cervical back: Normal range of motion and neck supple.     Right lower leg: No edema.     Left lower leg: No edema.  Lymphadenopathy:     Cervical: No cervical adenopathy.  Skin:    General: Skin is warm and dry.  Neurological:     General: No focal deficit present.     Mental Status: She is alert and oriented to person, place, and time. Mental status is at baseline.  Psychiatric:        Mood and Affect: Mood normal.        Behavior: Behavior normal.        Thought Content: Thought content normal.  Judgment: Judgment normal.    LABS:   CBC Latest Ref Rng & Units 09/03/2019 03/17/2010 03/16/2010  WBC 3.4 - 10.8 x10E3/uL 6.3 8.2 9.7  Hemoglobin 11.1 - 15.9 g/dL 13.4 10.1(L) 11.2(L)  Hematocrit 34.0 - 46.6 % 40.3 30.4(L) 33.5(L)  Platelets 150 - 450 x10E3/uL 288 164 187   CMP Latest Ref Rng & Units 09/03/2019 06/25/2019 03/17/2010  Glucose 65 - 99 mg/dL 94 100(A) 126(H)  BUN 8 - 27 mg/dL 8 6 5(L)  Creatinine 0.57 - 1.00 mg/dL 0.67 0.60 0.64  Sodium 134 - 144 mmol/L 143 141 136  Potassium 3.5 - 5.2 mmol/L 4.9 3.9 3.6  Chloride 96 - 106 mmol/L 106 - 106  CO2 20 - 29 mmol/L 22 - 24  Calcium 8.7 - 10.3 mg/dL 9.8 - 8.7  Total Protein 6.0 - 8.5 g/dL 6.8 - -  Total Bilirubin 0.0 - 1.2 mg/dL 0.4 - -  Alkaline Phos 48 - 121 IU/L 117 - -  AST 0 - 40 IU/L 15 28 -  ALT 0 - 32 IU/L 7 10 -     No results found for: CEA1 / No results found for: CEA1   STUDIES:  No results found.   Allergies: No Known Allergies  Current Medications: Current Outpatient Medications  Medication Sig Dispense Refill  . clopidogrel (PLAVIX) 75 MG tablet Take 1 tablet (75 mg total)  by mouth daily. 90 tablet 1  . levothyroxine (SYNTHROID) 88 MCG tablet Take 1 tablet (88 mcg total) by mouth daily. 90 tablet 2   No current facility-administered medications for this visit.     ASSESSMENT & PLAN:   Assessment:   1. Stage IIIB colon cancer diagnosed in November 2019.  Treated with surgery and adjuvant capecitabine.  She remains without evidence of recurrence.  2. Osteoporosis with history of right hip fracture and now left pubic fracture.  The patient was started on Reclast in late August.  The patient started calcium and vitamin-D and will continue it at least daily.    3. Hypothyroidism but her TSH was in the normal range at her last visit.   Plan: She is doing fairly well at this time, and is over 2 years out from her diagnosis.  I do recommend at least one more colonoscopy as long as she remains healthy.  We will plan to see her back in 3 months with a CBC, comprehensive metabolic panel, TSH and CEA.  We will then go to 4 month follow up .  She will be due for IV Reclast again at the end of August.  The patient understands the plans discussed today and is in agreement with them.  She knows to contact our office if she develops concerns prior to her next appointment.   I provided 25 minutes of face-to-face time during this this encounter and > 50% was spent counseling as documented under my assessment and plan.    Derwood Kaplan, MD [facility] [address] [place] [address] Dept: [phone] Dept Fax: [phone]   I, Rita Ohara, am acting as scribe for Derwood Kaplan, MD  I have reviewed this report as typed by the medical scribe, and it is complete and accurate.

## 2020-03-26 ENCOUNTER — Other Ambulatory Visit: Payer: Medicare Other

## 2020-03-26 ENCOUNTER — Ambulatory Visit: Payer: Medicare Other | Admitting: Oncology

## 2020-03-29 ENCOUNTER — Telehealth: Payer: Self-pay | Admitting: Oncology

## 2020-03-29 ENCOUNTER — Other Ambulatory Visit: Payer: PRIVATE HEALTH INSURANCE

## 2020-03-29 ENCOUNTER — Encounter: Payer: Self-pay | Admitting: Oncology

## 2020-03-29 ENCOUNTER — Other Ambulatory Visit: Payer: Self-pay | Admitting: Oncology

## 2020-03-29 ENCOUNTER — Other Ambulatory Visit: Payer: Self-pay

## 2020-03-29 ENCOUNTER — Other Ambulatory Visit: Payer: Self-pay | Admitting: Hematology and Oncology

## 2020-03-29 ENCOUNTER — Inpatient Hospital Stay: Payer: PRIVATE HEALTH INSURANCE | Attending: Oncology

## 2020-03-29 ENCOUNTER — Inpatient Hospital Stay (INDEPENDENT_AMBULATORY_CARE_PROVIDER_SITE_OTHER): Payer: PRIVATE HEALTH INSURANCE | Admitting: Oncology

## 2020-03-29 VITALS — BP 183/79 | HR 72 | Temp 97.8°F | Resp 18 | Ht 65.5 in | Wt 128.8 lb

## 2020-03-29 DIAGNOSIS — Z86010 Personal history of colonic polyps: Secondary | ICD-10-CM

## 2020-03-29 DIAGNOSIS — Z79899 Other long term (current) drug therapy: Secondary | ICD-10-CM

## 2020-03-29 DIAGNOSIS — C182 Malignant neoplasm of ascending colon: Secondary | ICD-10-CM

## 2020-03-29 DIAGNOSIS — E039 Hypothyroidism, unspecified: Secondary | ICD-10-CM | POA: Diagnosis not present

## 2020-03-29 DIAGNOSIS — Z85038 Personal history of other malignant neoplasm of large intestine: Secondary | ICD-10-CM | POA: Diagnosis not present

## 2020-03-29 DIAGNOSIS — R197 Diarrhea, unspecified: Secondary | ICD-10-CM | POA: Diagnosis not present

## 2020-03-29 DIAGNOSIS — D126 Benign neoplasm of colon, unspecified: Secondary | ICD-10-CM

## 2020-03-29 DIAGNOSIS — Z9049 Acquired absence of other specified parts of digestive tract: Secondary | ICD-10-CM | POA: Diagnosis not present

## 2020-03-29 DIAGNOSIS — M810 Age-related osteoporosis without current pathological fracture: Secondary | ICD-10-CM

## 2020-03-29 DIAGNOSIS — D649 Anemia, unspecified: Secondary | ICD-10-CM | POA: Diagnosis not present

## 2020-03-29 DIAGNOSIS — K635 Polyp of colon: Secondary | ICD-10-CM | POA: Insufficient documentation

## 2020-03-29 LAB — HEPATIC FUNCTION PANEL
ALT: 14 (ref 7–35)
AST: 25 (ref 13–35)
Alkaline Phosphatase: 72 (ref 25–125)
Bilirubin, Total: 0.4

## 2020-03-29 LAB — BASIC METABOLIC PANEL
BUN: 10 (ref 4–21)
CO2: 24 — AB (ref 13–22)
Chloride: 105 (ref 99–108)
Creatinine: 0.6 (ref 0.5–1.1)
Glucose: 165
Potassium: 3.6 (ref 3.4–5.3)
Sodium: 139 (ref 137–147)

## 2020-03-29 LAB — CBC AND DIFFERENTIAL
HCT: 38 (ref 36–46)
Hemoglobin: 12.6 (ref 12.0–16.0)
Neutrophils Absolute: 3.97
Platelets: 275 (ref 150–399)
WBC: 6.3

## 2020-03-29 LAB — COMPREHENSIVE METABOLIC PANEL
Albumin: 4.3 (ref 3.5–5.0)
Calcium: 9.6 (ref 8.7–10.7)

## 2020-03-29 LAB — CBC: RBC: 4.25 (ref 3.87–5.11)

## 2020-03-29 NOTE — Telephone Encounter (Signed)
Per 1/10 los next appt given to patient 

## 2020-03-30 ENCOUNTER — Other Ambulatory Visit: Payer: Self-pay

## 2020-03-30 ENCOUNTER — Other Ambulatory Visit: Payer: Self-pay | Admitting: Legal Medicine

## 2020-03-30 DIAGNOSIS — E038 Other specified hypothyroidism: Secondary | ICD-10-CM

## 2020-03-30 LAB — COMPREHENSIVE METABOLIC PANEL
ALT: 8 IU/L (ref 0–32)
AST: 16 IU/L (ref 0–40)
Albumin/Globulin Ratio: 1.8 (ref 1.2–2.2)
Albumin: 4.4 g/dL (ref 3.6–4.6)
Alkaline Phosphatase: 83 IU/L (ref 44–121)
BUN/Creatinine Ratio: 12 (ref 12–28)
BUN: 9 mg/dL (ref 8–27)
Bilirubin Total: 0.2 mg/dL (ref 0.0–1.2)
CO2: 24 mmol/L (ref 20–29)
Calcium: 9.8 mg/dL (ref 8.7–10.3)
Chloride: 106 mmol/L (ref 96–106)
Creatinine, Ser: 0.78 mg/dL (ref 0.57–1.00)
GFR calc Af Amer: 81 mL/min/{1.73_m2} (ref >59)
GFR calc non Af Amer: 70 mL/min/{1.73_m2} (ref >59)
Globulin, Total: 2.4 g/dL (ref 1.5–4.5)
Glucose: 113 mg/dL — ABNORMAL HIGH (ref 65–99)
Potassium: 4.1 mmol/L (ref 3.5–5.2)
Sodium: 142 mmol/L (ref 134–144)
Total Protein: 6.8 g/dL (ref 6.0–8.5)

## 2020-03-30 LAB — CBC WITH DIFFERENTIAL/PLATELET
Basophils Absolute: 0.1 10*3/uL (ref 0.0–0.2)
Basos: 1 %
EOS (ABSOLUTE): 0.2 10*3/uL (ref 0.0–0.4)
Eos: 3 %
Hematocrit: 37 % (ref 34.0–46.6)
Hemoglobin: 12.7 g/dL (ref 11.1–15.9)
Immature Grans (Abs): 0 10*3/uL (ref 0.0–0.1)
Immature Granulocytes: 0 %
Lymphocytes Absolute: 1.9 10*3/uL (ref 0.7–3.1)
Lymphs: 27 %
MCH: 30.3 pg (ref 26.6–33.0)
MCHC: 34.3 g/dL (ref 31.5–35.7)
MCV: 88 fL (ref 79–97)
Monocytes Absolute: 0.6 10*3/uL (ref 0.1–0.9)
Monocytes: 9 %
Neutrophils Absolute: 4.2 10*3/uL (ref 1.4–7.0)
Neutrophils: 60 %
Platelets: 291 10*3/uL (ref 150–450)
RBC: 4.19 x10E6/uL (ref 3.77–5.28)
RDW: 14.3 % (ref 11.7–15.4)
WBC: 7 10*3/uL (ref 3.4–10.8)

## 2020-03-30 LAB — CEA: CEA: 1.2 ng/mL (ref 0.0–4.7)

## 2020-03-30 LAB — TSH: TSH: 14.8 u[IU]/mL — ABNORMAL HIGH (ref 0.450–4.500)

## 2020-03-30 MED ORDER — LEVOTHYROXINE SODIUM 100 MCG PO TABS: 100.0000 ug | ORAL_TABLET | Freq: Every day | ORAL | 2 refills | Status: DC

## 2020-03-30 NOTE — Progress Notes (Signed)
Cea 1.2 normal level, TSH 14, 800 very high is patient on synthroid? If so increase to 100 mcg, cbc normal, glucose 113, kidney tests normal, liver tests normal,  lp

## 2020-04-06 ENCOUNTER — Other Ambulatory Visit: Payer: Self-pay

## 2020-04-06 ENCOUNTER — Other Ambulatory Visit: Payer: Self-pay | Admitting: Legal Medicine

## 2020-04-07 MED ORDER — CLOPIDOGREL BISULFATE 75 MG PO TABS
37.5000 mg | ORAL_TABLET | Freq: Every day | ORAL | 2 refills | Status: DC
Start: 2020-04-07 — End: 2020-11-16

## 2020-05-18 ENCOUNTER — Telehealth: Payer: Self-pay | Admitting: Oncology

## 2020-05-18 NOTE — Telephone Encounter (Signed)
Patient called to verify her next Appt in April

## 2020-05-20 ENCOUNTER — Other Ambulatory Visit: Payer: Self-pay | Admitting: Oncology

## 2020-05-20 DIAGNOSIS — C182 Malignant neoplasm of ascending colon: Secondary | ICD-10-CM

## 2020-06-23 ENCOUNTER — Telehealth: Payer: Self-pay | Admitting: Oncology

## 2020-06-23 NOTE — Telephone Encounter (Signed)
Per Staff Msg, patient rescheduled to 4/7 labs 1 pm - follow up 1:30 pm.  Patient confirmed appt

## 2020-06-24 ENCOUNTER — Inpatient Hospital Stay (INDEPENDENT_AMBULATORY_CARE_PROVIDER_SITE_OTHER): Payer: Medicare Other | Admitting: Oncology

## 2020-06-24 ENCOUNTER — Encounter: Payer: Self-pay | Admitting: Oncology

## 2020-06-24 ENCOUNTER — Telehealth: Payer: Self-pay | Admitting: Oncology

## 2020-06-24 ENCOUNTER — Other Ambulatory Visit: Payer: Self-pay

## 2020-06-24 ENCOUNTER — Other Ambulatory Visit: Payer: Self-pay | Admitting: Hematology and Oncology

## 2020-06-24 ENCOUNTER — Other Ambulatory Visit: Payer: Self-pay | Admitting: Oncology

## 2020-06-24 ENCOUNTER — Inpatient Hospital Stay: Payer: Medicare Other | Attending: Oncology

## 2020-06-24 VITALS — BP 144/73 | HR 72 | Temp 98.3°F | Resp 18 | Ht 65.5 in | Wt 127.8 lb

## 2020-06-24 DIAGNOSIS — C182 Malignant neoplasm of ascending colon: Secondary | ICD-10-CM | POA: Diagnosis not present

## 2020-06-24 DIAGNOSIS — D649 Anemia, unspecified: Secondary | ICD-10-CM | POA: Diagnosis not present

## 2020-06-24 LAB — BASIC METABOLIC PANEL
BUN: 10 (ref 4–21)
CO2: 23 — AB (ref 13–22)
Chloride: 109 — AB (ref 99–108)
Creatinine: 0.6 (ref 0.5–1.1)
Glucose: 93
Potassium: 3.3 — AB (ref 3.4–5.3)
Sodium: 140 (ref 137–147)

## 2020-06-24 LAB — COMPREHENSIVE METABOLIC PANEL
Albumin: 4.3 (ref 3.5–5.0)
Calcium: 9 (ref 8.7–10.7)

## 2020-06-24 LAB — HEPATIC FUNCTION PANEL
ALT: 12 (ref 7–35)
AST: 22 (ref 13–35)
Alkaline Phosphatase: 81 (ref 25–125)
Bilirubin, Total: 0.7

## 2020-06-24 LAB — CBC AND DIFFERENTIAL
HCT: 39 (ref 36–46)
Hemoglobin: 12.8 (ref 12.0–16.0)
Neutrophils Absolute: 3.72
Platelets: 238 (ref 150–399)
WBC: 6.3

## 2020-06-24 LAB — CBC: RBC: 4.29 (ref 3.87–5.11)

## 2020-06-24 NOTE — Telephone Encounter (Signed)
Per 4/7 los next appt scheduled and given to patient 

## 2020-06-24 NOTE — Progress Notes (Signed)
[facility]  [address] [place],  [place]  [address] [phone]  Clinic Day:  06/24/2020  Referring physician: [name],*   This document serves as a record of services personally performed by Hosie Poisson, MD. It was created on their behalf by [facility] E, a trained medical scribe. The creation of this record is based on the scribe's personal observations and the provider's statements to them.  CHIEF COMPLAINT:  CC: Stage IIIB colon cancer  Current Treatment:  Surveillance  HISTORY OF PRESENT ILLNESS:  [name] is a 86 y.o. female with stage IIIB (T3 N1b M0) colon cancer diagnosed in November 2019.  She had never had a colonoscopy before, but had unintentional weight loss of 35 lb over the last year with decreased appetite.  A stool Hemoccult was positive.  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  The right colon revealed a sessile serrated polyp with an area of low-grade dysplasia as well as a tubular adenoma and small fragments of adenocarcinoma.  The left colon also revealed tubular adenomas and a benign hyperplastic polyp.  Small bowel biopsies were benign.  CT scan showed a few tiny subcentimeter lesions in the left hepatic lobe of low-attenuation which appear most consistent with cysts.  There were multiple multiple small gallstones and a small benign appearing left renal cyst.  However, she had an annular soft tissue mass of the ascending colon with several subcentimeter pericolonic nodes up to 9 mm in diameter which were suspicious for metastatic disease.  She was referred to Dr. Noberto Retort and underwent laparoscopic right hemicolectomy and primary ileocolostomy, as well as a cholecystectomy, in December 2019.  The final pathology revealed 2 separate tumors, 1 measuring 9.5 cm and the other 0.4 cm.  This was a moderately differentiated invasive colorectal  adenocarcinoma with mucinous features.  It extended into the pericolonic connective tissue and the smaller tumor extended into the muscularis propria.  There were some signet ring features and 3/35 nodes were positive for metastasis.  There was lymphovascular invasion.  Margins were clear.  The gallbladder revealed cholelithiasis and chronic cholecystitis.  Postop CEA was 8.3, but it was normal by January 2020.  I recommended adjuvant chemotherapy.  She wanted to take oral chemotherapy, so was placed on capecitabine 1000 mg twice daily in February 2020.  Due to toxicities, we eventually lowered her dose to 1000 mg in the morning and 500 mg in the afternoon.  The iron pills made her diarrhea worse, so that was discontinued.  She completed 6 cycles of capecitabine in June 2020.  She underwent colonoscopy to ileocolic anastomosis in December 2020 with Dr. Noberto Retort. which was negative.  She fell and fractured her right hip at the end of January 2021, and underwent surgery with Dr. Samule Dry on February 1st, 2021.  We saw her for routine follow-up in April and recommended a bone density scan.  This revealed osteoporosis with a T-score of -2 5 in the left femur, as well as a T-score -1.8 the spine.  We discussed treatment options with the patient via telephone and she opted for yearly IV Reclast, which we had planned to give her at her routine follow-up in July.  She was instructed to start calcium and vitamin-D as well.  She presented to emergency department due to another fall and CT imaging there was a minimally displaced fracture involving the left pubic body, extending into the superior ramus, with  an additional fracture of the left pubic root as well, extending into the superior pubic ramus.    INTERVAL HISTORY:  [name] is here for routine follow up and states that she has been doing very well.  She continues to have chronic right hip pain, and uses a cane to ambulate.  She notes diarrhea, but would rather have this  than constipation.  She states that her last colonoscopy was in December 2020, and Dr. Noberto Retort told her that she would not need any more.  Blood counts and chemistries are unremarkable.  Her  appetite is good, and she has lost 1 pound since her last visit.  She denies fever, chills or other signs of infection.  She denies nausea, vomiting, bowel issues, or abdominal pain.  She denies sore throat, cough, dyspnea, or chest pain.  REVIEW OF SYSTEMS:  Review of Systems  Constitutional: Negative.  Negative for appetite change, chills, fatigue, fever and unexpected weight change.  HENT:  Negative.   Eyes: Negative.   Respiratory: Negative.  Negative for chest tightness, cough, hemoptysis, shortness of breath and wheezing.   Cardiovascular: Negative.  Negative for chest pain, leg swelling and palpitations.  Gastrointestinal: Positive for diarrhea. Negative for abdominal distention, abdominal pain, blood in stool, constipation, nausea and vomiting.  Endocrine: Negative.   Genitourinary: Negative.  Negative for difficulty urinating, dysuria, frequency and hematuria.   Musculoskeletal: Positive for gait problem (uses a cane to ambulate). Negative for arthralgias, back pain, flank pain and myalgias.       Chronic right hip pain  Skin: Negative.   Neurological: Positive for gait problem (uses a cane to ambulate). Negative for dizziness, extremity weakness, headaches, light-headedness, numbness, seizures and speech difficulty.  Hematological: Negative.   Psychiatric/Behavioral: Negative.  Negative for depression and sleep disturbance. The patient is not nervous/anxious.      VITALS:  Blood pressure (!) 144/73, pulse 72, temperature 98.3 F (36.8 C), temperature source Oral, resp. rate 18, height 5' 5.5" (1.664 m), weight 127 lb 12.8 oz (58 kg), SpO2 98 %.  Wt Readings from Last 3 Encounters:  06/24/20 127 lb 12.8 oz (58 kg)  03/29/20 128 lb 12.8 oz (58.4 kg)  09/03/19 125 lb (56.7 kg)    Body mass  index is 20.94 kg/m.  Performance status (ECOG): 1 - Symptomatic but completely ambulatory  PHYSICAL EXAM:  Physical Exam Constitutional:      General: She is not in acute distress.    Appearance: Normal appearance. She is normal weight.  HENT:     Head: Normocephalic and atraumatic.  Eyes:     General: No scleral icterus.    Extraocular Movements: Extraocular movements intact.     Conjunctiva/sclera: Conjunctivae normal.     Pupils: Pupils are equal, round, and reactive to light.  Cardiovascular:     Rate and Rhythm: Normal rate and regular rhythm.     Pulses: Normal pulses.     Heart sounds: Normal heart sounds. No murmur heard. No friction rub. No gallop.   Pulmonary:     Effort: Pulmonary effort is normal. No respiratory distress.     Breath sounds: Normal breath sounds.  Abdominal:     General: Bowel sounds are normal. There is no distension.     Palpations: Abdomen is soft. There is no hepatomegaly, splenomegaly or mass.     Tenderness: There is no abdominal tenderness.  Musculoskeletal:        General: Normal range of motion.     Cervical back: Normal  range of motion and neck supple.     Right lower leg: Edema (trace) present.     Left lower leg: No edema.  Lymphadenopathy:     Cervical: No cervical adenopathy.  Skin:    General: Skin is warm and dry.  Neurological:     General: No focal deficit present.     Mental Status: She is alert and oriented to person, place, and time. Mental status is at baseline.  Psychiatric:        Mood and Affect: Mood normal.        Behavior: Behavior normal.        Thought Content: Thought content normal.        Judgment: Judgment normal.    LABS:   CBC Latest Ref Rng & Units 03/29/2020 03/29/2020 09/03/2019  WBC 3.4 - 10.8 x10E3/uL 7.0 6.3 6.3  Hemoglobin 11.1 - 15.9 g/dL 12.7 12.6 13.4  Hematocrit 34.0 - 46.6 % 37.0 38 40.3  Platelets 150 - 450 x10E3/uL 291 275 288   CMP Latest Ref Rng & Units 03/29/2020 03/29/2020 09/03/2019   Glucose 65 - 99 mg/dL 113(H) - 94  BUN 8 - 27 mg/dL 9 10 8   Creatinine 0.57 - 1.00 mg/dL 0.78 0.6 0.67  Sodium 134 - 144 mmol/L 142 139 143  Potassium 3.5 - 5.2 mmol/L 4.1 3.6 4.9  Chloride 96 - 106 mmol/L 106 105 106  CO2 20 - 29 mmol/L 24 24(A) 22  Calcium 8.7 - 10.3 mg/dL 9.8 9.6 9.8  Total Protein 6.0 - 8.5 g/dL 6.8 - 6.8  Total Bilirubin 0.0 - 1.2 mg/dL 0.2 - 0.4  Alkaline Phos 44 - 121 IU/L 83 72 117  AST 0 - 40 IU/L 16 25 15   ALT 0 - 32 IU/L 8 14 7      Lab Results  Component Value Date   CEA1 1.2 03/29/2020   /  CEA  Date Value Ref Range Status  03/29/2020 1.2 0.0 - 4.7 ng/mL Final    Comment:                                 Nonsmokers          <3.9                              Smokers             <5.6 [employer] Electrochemiluminescence Immunoassay (ECLIA) Values obtained with different assay methods or kits cannot be used interchangeably.  Results cannot be interpreted as absolute evidence of the presence or absence of malignant disease.      STUDIES:  No results found.   Allergies: No Known Allergies  Current Medications: Current Outpatient Medications  Medication Sig Dispense Refill  . clopidogrel (PLAVIX) 75 MG tablet Take 0.5 tablets (37.5 mg total) by mouth daily. 45 tablet 2  . levothyroxine (SYNTHROID) 100 MCG tablet Take 1 tablet (100 mcg total) by mouth daily before breakfast. 90 tablet 2   No current facility-administered medications for this visit.     ASSESSMENT & PLAN:   Assessment:   1. Stage IIIB colon cancer diagnosed in November 2019.  Treated with surgery and adjuvant capecitabine.  She remains without evidence of recurrence.  2. Osteoporosis with history of right hip fracture and now left pubic fracture.  The patient was started on Reclast in late August.  The patient started calcium and vitamin-D and will continue it at least daily.    3. Hypothyroidism but her TSH was in the normal range at her last visit.   Plan: She  is doing fairly well at this time, and is nearly 2 and 1/2 years out from her diagnosis.  I do recommend repeat colonoscopy as long as she remains healthy.  We will plan to see her back in 4 months with a CBC, comprehensive metabolic panel, and CEA.  She will be due for IV Reclast again at the end of August.  The patient understands the plans discussed today and is in agreement with them.  She knows to contact our office if she develops concerns prior to her next appointment.   I provided 25 minutes of face-to-face time during this this encounter and > 50% was spent counseling as documented under my assessment and plan.    Derwood Kaplan, MD [facility] AT [facility] [address] [place] [address] Dept: [phone] Dept Fax: [phone]   I, Rita Ohara, am acting as scribe for Derwood Kaplan, MD  I have reviewed this report as typed by the medical scribe, and it is complete and accurate.

## 2020-06-25 LAB — CEA: CEA: 1.2 ng/mL (ref 0.0–4.7)

## 2020-06-29 ENCOUNTER — Inpatient Hospital Stay: Payer: Medicare Other

## 2020-06-29 ENCOUNTER — Inpatient Hospital Stay: Payer: Medicare Other | Admitting: Oncology

## 2020-07-01 ENCOUNTER — Telehealth: Payer: Self-pay | Admitting: Oncology

## 2020-07-01 NOTE — Telephone Encounter (Signed)
Per 4/7 Staff Msg, patient scheduled for 8/26 [place].  Requested patient be give Appt Summary at next visit

## 2020-10-05 DIAGNOSIS — H2513 Age-related nuclear cataract, bilateral: Secondary | ICD-10-CM | POA: Diagnosis not present

## 2020-10-20 ENCOUNTER — Encounter: Payer: Self-pay | Admitting: Oncology

## 2020-10-21 DIAGNOSIS — T63441A Toxic effect of venom of bees, accidental (unintentional), initial encounter: Secondary | ICD-10-CM | POA: Diagnosis not present

## 2020-10-21 DIAGNOSIS — T782XXA Anaphylactic shock, unspecified, initial encounter: Secondary | ICD-10-CM | POA: Diagnosis not present

## 2020-10-21 DIAGNOSIS — M79643 Pain in unspecified hand: Secondary | ICD-10-CM | POA: Diagnosis not present

## 2020-10-21 DIAGNOSIS — T63484A Toxic effect of venom of other arthropod, undetermined, initial encounter: Secondary | ICD-10-CM | POA: Diagnosis not present

## 2020-10-21 DIAGNOSIS — R609 Edema, unspecified: Secondary | ICD-10-CM | POA: Diagnosis not present

## 2020-10-21 DIAGNOSIS — Z743 Need for continuous supervision: Secondary | ICD-10-CM | POA: Diagnosis not present

## 2020-10-27 ENCOUNTER — Ambulatory Visit: Payer: Medicare Other | Admitting: Oncology

## 2020-10-27 ENCOUNTER — Other Ambulatory Visit: Payer: Medicare Other

## 2020-11-02 ENCOUNTER — Telehealth: Payer: Self-pay | Admitting: Oncology

## 2020-11-02 NOTE — Progress Notes (Signed)
[facility]  [address] [place],  [place]  [address] [phone]  Clinic Day:  11/10/2020  Referring physician: [name],*   This document serves as a record of services personally performed by Hosie Poisson, MD. It was created on their behalf by [facility] E, a trained medical scribe. The creation of this record is based on the scribe's personal observations and the provider's statements to them.  CHIEF COMPLAINT:  CC: Stage IIIB colon cancer  Current Treatment:  Surveillance  HISTORY OF PRESENT ILLNESS:  [name] is a 86 y.o. female with stage IIIB (T3 N1b M0) colon cancer diagnosed in November 2019.  She had never had a colonoscopy before, but had unintentional weight loss of 35 lb over the last year with decreased appetite.  A stool Hemoccult was positive.  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  The right colon revealed a sessile serrated polyp with an area of low-grade dysplasia as well as a tubular adenoma and small fragments of adenocarcinoma.  The left colon also revealed tubular adenomas and a benign hyperplastic polyp.  Small bowel biopsies were benign.  CT scan showed a few tiny subcentimeter lesions in the left hepatic lobe of low-attenuation which appear most consistent with cysts.  There were multiple multiple small gallstones and a small benign appearing left renal cyst.  However, she had an annular soft tissue mass of the ascending colon with several subcentimeter pericolonic nodes up to 9 mm in diameter which were suspicious for metastatic disease.  She was referred to Dr. Noberto Retort and underwent laparoscopic right hemicolectomy and primary ileocolostomy, as well as a cholecystectomy, in December 2019.  The final pathology revealed 2 separate tumors, 1 measuring 9.5 cm and the other 0.4 cm.  This was a moderately differentiated invasive colorectal  adenocarcinoma with mucinous features.  It extended into the pericolonic connective tissue and the smaller tumor extended into the muscularis propria.  There were some signet ring features and 3/35 nodes were positive for metastasis.  There was lymphovascular invasion.  Margins were clear.  The gallbladder revealed cholelithiasis and chronic cholecystitis.  Postop CEA was 8.3, but it was normal by January 2020.  I recommended adjuvant chemotherapy.  She wanted to take oral chemotherapy, so was placed on capecitabine 1000 mg twice daily in February 2020.  Due to toxicities, we eventually lowered her dose to 1000 mg in the morning and 500 mg in the afternoon.  The iron pills made her diarrhea worse, so that was discontinued.  She completed 6 cycles of capecitabine in June 2020.  She underwent colonoscopy to ileocolic anastomosis in December 2020 with Dr. Noberto Retort. which was negative.  She fell and fractured her right hip at the end of January 2021, and underwent surgery with Dr. Samule Dry on February 1st, 2021.  We saw her for routine follow-up in April and recommended a bone density scan.  This revealed osteoporosis with a T-score of -2 5 in the left femur, as well as a T-score -1.8 the spine.  We discussed treatment options with the patient via telephone and she opted for yearly IV Reclast, which we had planned to give her at her routine follow-up in July.  She was instructed to start calcium and vitamin-D as well.  She presented to emergency department due to another fall and CT imaging there was a minimally displaced fracture involving the left pubic body, extending into the superior ramus, with  an additional fracture of the left pubic root as well, extending into the superior pubic ramus.    INTERVAL HISTORY:  [name] is here for routine follow up and states that she was stung by some type of bee or hornet on her right palm, and had an allergic reaction.  She noted swelling of the tongue, and came to the emergency  room by ambulance.  She now has an Epipen at home in case of emergency.  She does note loose stools and occasions of diarrhea.  She also has her usual back pain.  Blood counts are unremarkable.  Her  appetite is good, and she has gained 3 and 1/2 pounds since her last visit.  She denies fever, chills or other signs of infection.  She denies nausea, vomiting, bowel issues, or abdominal pain.  She denies sore throat, cough, dyspnea, or chest pain.  REVIEW OF SYSTEMS:  Review of Systems  Constitutional: Negative.  Negative for appetite change, chills, fatigue, fever and unexpected weight change.  HENT:  Negative.    Eyes: Negative.   Respiratory: Negative.  Negative for chest tightness, cough, hemoptysis, shortness of breath and wheezing.   Cardiovascular: Negative.  Negative for chest pain, leg swelling and palpitations.  Gastrointestinal:  Positive for diarrhea (on occasion). Negative for abdominal distention, abdominal pain, blood in stool, constipation, nausea and vomiting.       Loose stools  Endocrine: Negative.   Genitourinary: Negative.  Negative for difficulty urinating, dysuria, frequency and hematuria.   Musculoskeletal:  Positive for back pain. Negative for arthralgias, flank pain, gait problem and myalgias.  Skin: Negative.   Neurological: Negative.  Negative for dizziness, extremity weakness, gait problem, headaches, light-headedness, numbness, seizures and speech difficulty.  Hematological: Negative.   Psychiatric/Behavioral: Negative.  Negative for depression and sleep disturbance. The patient is not nervous/anxious.     VITALS:  Blood pressure (!) 157/67, pulse 62, temperature 98 F (36.7 C), temperature source Oral, resp. rate 18, height 5' 5.5" (1.664 m), weight 131 lb 6.4 oz (59.6 kg), SpO2 100 %.  Wt Readings from Last 3 Encounters:  11/10/20 131 lb 6.4 oz (59.6 kg)  06/24/20 127 lb 12.8 oz (58 kg)  03/29/20 128 lb 12.8 oz (58.4 kg)    Body mass index is 21.53  kg/m.  Performance status (ECOG): 1 - Symptomatic but completely ambulatory  PHYSICAL EXAM:  Physical Exam Constitutional:      General: She is not in acute distress.    Appearance: Normal appearance. She is normal weight.  HENT:     Head: Normocephalic and atraumatic.  Eyes:     General: No scleral icterus.    Extraocular Movements: Extraocular movements intact.     Conjunctiva/sclera: Conjunctivae normal.     Pupils: Pupils are equal, round, and reactive to light.  Cardiovascular:     Rate and Rhythm: Normal rate and regular rhythm.     Pulses: Normal pulses.     Heart sounds: Normal heart sounds. No murmur heard.   No friction rub. No gallop.  Pulmonary:     Effort: Pulmonary effort is normal. No respiratory distress.     Breath sounds: Normal breath sounds.  Abdominal:     General: Bowel sounds are normal. There is no distension.     Palpations: Abdomen is soft. There is no hepatomegaly, splenomegaly or mass.     Tenderness: There is no abdominal tenderness.  Musculoskeletal:        General: Normal range of motion.  Cervical back: Normal range of motion and neck supple.     Right lower leg: No edema.     Left lower leg: No edema.  Lymphadenopathy:     Cervical: No cervical adenopathy.  Skin:    General: Skin is warm and dry.  Neurological:     General: No focal deficit present.     Mental Status: She is alert and oriented to person, place, and time. Mental status is at baseline.  Psychiatric:        Mood and Affect: Mood normal.        Behavior: Behavior normal.        Thought Content: Thought content normal.        Judgment: Judgment normal.   LABS:   CBC Latest Ref Rng & Units 11/10/2020 06/24/2020 03/29/2020  WBC - 6.0 6.3 7.0  Hemoglobin 12.0 - 16.0 12.8 12.8 12.7  Hematocrit 36 - 46 38 39 37.0  Platelets 150 - 399 281 238 291   CMP Latest Ref Rng & Units 11/10/2020 06/24/2020 03/29/2020  Glucose 65 - 99 mg/dL - - 113(H)  BUN 4 - '21 9 10 9  '$ Creatinine 0.5  - 1.1 0.8 0.6 0.78  Sodium 137 - 147 141 140 142  Potassium 3.4 - 5.3 4.3 3.3(A) 4.1  Chloride 99 - 108 107 109(A) 106  CO2 13 - 22 25(A) 23(A) 24  Calcium 8.7 - 10.7 9.5 9.0 9.8  Total Protein 6.0 - 8.5 g/dL - - 6.8  Total Bilirubin 0.0 - 1.2 mg/dL - - 0.2  Alkaline Phos 25 - 125 70 81 83  AST 13 - 35 '27 22 16  '$ ALT 7 - 35 '12 12 8     '$ Lab Results  Component Value Date   CEA1 1.2 06/24/2020   /  CEA  Date Value Ref Range Status  06/24/2020 1.2 0.0 - 4.7 ng/mL Final    Comment:    (NOTE)                             Nonsmokers          <3.9                             Smokers             <5.6 [employer] Electrochemiluminescence Immunoassay (ECLIA) Values obtained with different assay methods or kits cannot be used interchangeably.  Results cannot be interpreted as absolute evidence of the presence or absence of malignant disease. Performed At: [facility] [place], [place] [id number] Rush Farmer MD [id number]      STUDIES:  No results found.   Allergies:  Allergies  Allergen Reactions   Other Anaphylaxis    Hornet sting    Current Medications: Current Outpatient Medications  Medication Sig Dispense Refill   clopidogrel (PLAVIX) 75 MG tablet Take 0.5 tablets (37.5 mg total) by mouth daily. 45 tablet 2   EPINEPHrine 0.3 mg/0.3 mL IJ SOAJ injection Inject into the muscle.     levothyroxine (SYNTHROID) 100 MCG tablet Take 1 tablet (100 mcg total) by mouth daily before breakfast. 90 tablet 2   No current facility-administered medications for this visit.     ASSESSMENT & PLAN:   Assessment:   1. Stage IIIB colon cancer diagnosed in November 2019.  Treated with surgery and adjuvant capecitabine.  She remains without evidence  of recurrence.  2. Osteoporosis with history of right hip fracture and now left pubic fracture.  The patient was started on Reclast in late August.  The patient started calcium and vitamin-D and will  continue it at least daily.    3. Hypothyroidism but her TSH was in the normal range at her last visit.  We have repeated this today.  Plan: She continues to do fairly well, and is nearly 3 years out from her diagnosis.  I will call her with the results of her chemistries and TSH.  I do recommend repeat colonoscopy as long as she remains healthy, and she will be due in December 2023.  She is scheduled for IV Reclast tomorrow.  We will plan to see her back in 4 months with a CBC, comprehensive metabolic panel, and CEA.  The patient understands the plans discussed today and is in agreement with them.  She knows to contact our office if she develops concerns prior to her next appointment.   I provided 20 minutes of face-to-face time during this this encounter and > 50% was spent counseling as documented under my assessment and plan.    Derwood Kaplan, MD [facility] AT [facility] [address] [place] [address] Dept: [phone] Dept Fax: [phone]   I, Rita Ohara, am acting as scribe for Derwood Kaplan, MD  I have reviewed this report as typed by the medical scribe, and it is complete and accurate.

## 2020-11-02 NOTE — Telephone Encounter (Signed)
Patient rescheduled 8/26 Infusion to 8/25

## 2020-11-04 ENCOUNTER — Other Ambulatory Visit: Payer: Self-pay | Admitting: Pharmacist

## 2020-11-09 ENCOUNTER — Telehealth: Payer: Self-pay | Admitting: Oncology

## 2020-11-09 NOTE — Telephone Encounter (Signed)
Patient called to verify 8/24 Appt's

## 2020-11-10 ENCOUNTER — Inpatient Hospital Stay (INDEPENDENT_AMBULATORY_CARE_PROVIDER_SITE_OTHER): Payer: Medicare Other | Admitting: Oncology

## 2020-11-10 ENCOUNTER — Other Ambulatory Visit: Payer: Self-pay | Admitting: Oncology

## 2020-11-10 ENCOUNTER — Other Ambulatory Visit: Payer: Self-pay

## 2020-11-10 ENCOUNTER — Encounter: Payer: Self-pay | Admitting: Oncology

## 2020-11-10 ENCOUNTER — Inpatient Hospital Stay: Payer: Medicare Other | Attending: Oncology

## 2020-11-10 VITALS — BP 157/67 | HR 62 | Temp 98.0°F | Resp 18 | Ht 65.5 in | Wt 131.4 lb

## 2020-11-10 DIAGNOSIS — D649 Anemia, unspecified: Secondary | ICD-10-CM | POA: Diagnosis not present

## 2020-11-10 DIAGNOSIS — E039 Hypothyroidism, unspecified: Secondary | ICD-10-CM | POA: Insufficient documentation

## 2020-11-10 DIAGNOSIS — M810 Age-related osteoporosis without current pathological fracture: Secondary | ICD-10-CM | POA: Diagnosis not present

## 2020-11-10 DIAGNOSIS — C182 Malignant neoplasm of ascending colon: Secondary | ICD-10-CM | POA: Insufficient documentation

## 2020-11-10 LAB — BASIC METABOLIC PANEL
BUN: 9 (ref 4–21)
CO2: 25 — AB (ref 13–22)
Chloride: 107 (ref 99–108)
Creatinine: 0.8 (ref 0.5–1.1)
Glucose: 92
Potassium: 4.3 (ref 3.4–5.3)
Sodium: 141 (ref 137–147)

## 2020-11-10 LAB — HEPATIC FUNCTION PANEL
ALT: 12 (ref 7–35)
AST: 27 (ref 13–35)
Alkaline Phosphatase: 70 (ref 25–125)
Bilirubin, Total: 0.4

## 2020-11-10 LAB — COMPREHENSIVE METABOLIC PANEL
Albumin: 4.2 (ref 3.5–5.0)
Calcium: 9.5 (ref 8.7–10.7)

## 2020-11-10 LAB — CBC AND DIFFERENTIAL
HCT: 38 (ref 36–46)
Hemoglobin: 12.8 (ref 12.0–16.0)
Neutrophils Absolute: 3.54
Platelets: 281 (ref 150–399)
WBC: 6

## 2020-11-10 LAB — CBC: RBC: 4.16 (ref 3.87–5.11)

## 2020-11-11 ENCOUNTER — Other Ambulatory Visit: Payer: Self-pay

## 2020-11-11 ENCOUNTER — Inpatient Hospital Stay: Payer: Medicare Other

## 2020-11-11 VITALS — BP 124/70 | HR 78 | Temp 98.7°F | Resp 18 | Ht 65.5 in | Wt 133.2 lb

## 2020-11-11 DIAGNOSIS — M810 Age-related osteoporosis without current pathological fracture: Secondary | ICD-10-CM | POA: Diagnosis not present

## 2020-11-11 DIAGNOSIS — C182 Malignant neoplasm of ascending colon: Secondary | ICD-10-CM | POA: Diagnosis not present

## 2020-11-11 DIAGNOSIS — E039 Hypothyroidism, unspecified: Secondary | ICD-10-CM | POA: Diagnosis not present

## 2020-11-11 LAB — CEA: CEA: 1.4 ng/mL (ref 0.0–4.7)

## 2020-11-11 MED ORDER — SODIUM CHLORIDE 0.9 % IV SOLN: Freq: Once | INTRAVENOUS | Status: AC

## 2020-11-11 MED ORDER — ZOLEDRONIC ACID 4 MG/100ML IV SOLN
4.0000 mg | Freq: Once | INTRAVENOUS | Status: AC
  Administered 2020-11-11: 4 mg via INTRAVENOUS
  Filled 2020-11-11: qty 100

## 2020-11-11 NOTE — Patient Instructions (Signed)

## 2020-11-12 ENCOUNTER — Ambulatory Visit: Payer: Medicare Other

## 2020-11-12 ENCOUNTER — Encounter: Payer: Self-pay | Admitting: Oncology

## 2020-11-12 ENCOUNTER — Telehealth: Payer: Self-pay | Admitting: Oncology

## 2020-11-12 NOTE — Telephone Encounter (Signed)
Scheduled appt per 8/24 los - mailed letter with appt date and time  

## 2020-11-15 ENCOUNTER — Other Ambulatory Visit: Payer: Self-pay | Admitting: Oncology

## 2020-11-15 ENCOUNTER — Telehealth: Payer: Self-pay

## 2020-11-15 DIAGNOSIS — C182 Malignant neoplasm of ascending colon: Secondary | ICD-10-CM | POA: Diagnosis not present

## 2020-11-15 DIAGNOSIS — E039 Hypothyroidism, unspecified: Secondary | ICD-10-CM | POA: Diagnosis not present

## 2020-11-15 DIAGNOSIS — M810 Age-related osteoporosis without current pathological fracture: Secondary | ICD-10-CM | POA: Diagnosis not present

## 2020-11-15 LAB — TSH: TSH: 0.376 u[IU]/mL (ref 0.350–4.500)

## 2020-11-15 NOTE — Telephone Encounter (Addendum)
Pt notiied of Dr Remi Deter response below and that I have faxed the office note and labs to Dr Henrene Pastor. I did notate on fax that pt needs refill of the synthroid & plavix to [facility] in [place].  RE: Thyroid level and plavix labs Received: Pattricia Boss, Chinita Pester, MD  Dairl Ponder, RN Phone Number: [phone]   No, I am not the prescriber for these meds, Dr. Henrene Pastor is.  Her thyroid is good so stay on same dose.  Please send copy of all of her labs to DR. Henrene Pastor        Previous Messages  Pt is requesting refill of her synthroid and Plavix.  ----- Message from Derwood Kaplan, MD sent at 11/12/2020  6:37 PM EDT ----- Regarding: RE: Thyroid level and plavix labs Contact: [phone] Tell her chemistries and CEA (cancer test) are normal.  I don't recall any Rx to send in What do you mean by plavix labs?  It looks like I forgot to order the TSH, will ask Isaias Sakai if can add it ----- Message ----- From: Dairl Ponder, RN Sent: 11/12/2020   3:12 PM EDT To: Derwood Kaplan, MD Subject: Thyroid level and plavix labs                  Pt called to get blood work results from Wednesday. She also mentioned she thought you were going to send eRx in for something.

## 2020-11-16 ENCOUNTER — Telehealth: Payer: Self-pay

## 2020-11-16 ENCOUNTER — Other Ambulatory Visit: Payer: Self-pay | Admitting: Legal Medicine

## 2020-11-16 ENCOUNTER — Encounter: Payer: Self-pay | Admitting: Oncology

## 2020-11-16 DIAGNOSIS — I69323 Fluency disorder following cerebral infarction: Secondary | ICD-10-CM

## 2020-11-16 DIAGNOSIS — E038 Other specified hypothyroidism: Secondary | ICD-10-CM

## 2020-11-16 MED ORDER — CLOPIDOGREL BISULFATE 75 MG PO TABS: 37.5000 mg | ORAL_TABLET | Freq: Every day | ORAL | 2 refills | Status: DC

## 2020-11-16 MED ORDER — LEVOTHYROXINE SODIUM 100 MCG PO TABS: 100.0000 ug | ORAL_TABLET | Freq: Every day | ORAL | 2 refills | Status: DC

## 2020-11-16 NOTE — Telephone Encounter (Addendum)
  Dairl Ponder, RN  Registered Nurse  Oncology  Telephone Encounter  Addendum  Encounter Date:  11/15/2020             Show:Clear all '[x]'$ Written'[]'$ Templated'[x]'$ Copied  Added by: '[x]'$ Dairl Ponder, RN  '[]'$ Hover for details                               Pt notified of Dr Remi Deter response below and that I have faxed the office note and labs to Dr Henrene Pastor. I did notate on fax that pt needs refill of the synthroid & plavix to [facility] in [place].   RE: Thyroid level and plavix labs Received: [name], Chinita Pester, MD  Dairl Ponder, RN Phone Number: [phone]    No, I am not the prescriber for these meds, Dr. Henrene Pastor is.  Her thyroid is good so stay on same dose.  Please send copy of all of her labs to DR. Henrene Pastor         Previous Messages   Pt is requesting refill of her synthroid and Plavix.   ----- Message from Derwood Kaplan, MD sent at 11/12/2020  6:37 PM EDT ----- Regarding: RE: Thyroid level and plavix labs Contact: [phone] Tell her chemistries and CEA (cancer test) are normal.  I don't recall any Rx to send in What do you mean by plavix labs?  It looks like I forgot to order the TSH, will ask Isaias Sakai if can add it ----- Message ----- From: Dairl Ponder, RN Sent: 11/12/2020   3:12 PM EDT To: Derwood Kaplan, MD Subject: Thyroid level and plavix labs                   Pt called to get blood work results from Wednesday. She also mentioned she thought you were going to send eRx in for something.         Electronically signed by Dairl Ponder, RN at 11/15/2020 12:05 PM  Electronically signed by Dairl Ponder, RN at 11/15/2020 12:07 PM  Electronically signed by Dairl Ponder, RN at 11/15/2020  4:23 PM  Electronically signed by Dairl Ponder, RN at 11/16/2020  9:22 AM Telephone on 11/15/2020  Telephone on 11/15/2020   Revision History

## 2021-05-05 NOTE — Progress Notes (Signed)
[facility]  [address] [place],  [place]  [address] [phone]  Clinic Day:  05/11/2021  Referring physician: [name],*   This document serves as a record of services personally performed by Hosie Poisson, MD. It was created on their behalf by Curry,Lauren E, a trained medical scribe. The creation of this record is based on the scribe's personal observations and the provider's statements to them.  CHIEF COMPLAINT:  CC: Stage IIIB colon cancer  Current Treatment:  Surveillance  HISTORY OF PRESENT ILLNESS:  [name] is a 87 y.o. female with stage IIIB (T3 N1b M0) colon cancer diagnosed in November 2019.  She had never had a colonoscopy before, but had unintentional weight loss of 35 lb over the last year with decreased appetite.  A stool Hemoccult was positive.  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  The right colon revealed a sessile serrated polyp with an area of low-grade dysplasia as well as a tubular adenoma and small fragments of adenocarcinoma.  The left colon also revealed tubular adenomas and a benign hyperplastic polyp.  Small bowel biopsies were benign.  CT scan showed a few tiny subcentimeter lesions in the left hepatic lobe of low-attenuation which appear most consistent with cysts.  There were multiple multiple small gallstones and a small benign appearing left renal cyst.  However, she had an annular soft tissue mass of the ascending colon with several subcentimeter pericolonic nodes up to 9 mm in diameter which were suspicious for metastatic disease.  She was referred to Dr. Noberto Retort and underwent laparoscopic right hemicolectomy and primary ileocolostomy, as well as a cholecystectomy, in December 2019.  The final pathology revealed 2 separate tumors, 1 measuring 9.5 cm and the other 0.4 cm.  This was a moderately differentiated invasive colorectal  adenocarcinoma with mucinous features.  It extended into the pericolonic connective tissue and the smaller tumor extended into the muscularis propria.  There were some signet ring features and 3/35 nodes were positive for metastasis.  There was lymphovascular invasion.  Margins were clear.  The gallbladder revealed cholelithiasis and chronic cholecystitis.  Postop CEA was 8.3, but it was normal by January 2020.  I recommended adjuvant chemotherapy.  She wanted to take oral chemotherapy, so was placed on capecitabine 1000 mg twice daily in February 2020.  Due to toxicities, we eventually lowered her dose to 1000 mg in the morning and 500 mg in the afternoon.  The iron pills made her diarrhea worse, so that was discontinued.  She completed 6 cycles of capecitabine in June 2020.  She underwent colonoscopy to ileocolic anastomosis in December 2020 with Dr. Noberto Retort. which was negative.  She fell and fractured her right hip at the end of January 2021, and underwent surgery with Dr. Samule Dry on February 1st, 2021.  We saw her for routine follow-up in April and recommended a bone density scan.  This revealed osteoporosis with a T-score of -2 5 in the left femur, as well as a T-score -1.8 the spine.  We discussed treatment options with the patient via telephone and she opted for yearly IV Reclast, which we had planned to give her at her routine follow-up in July.  She was instructed to start calcium and vitamin-D as well.  She presented to emergency department due to another fall and CT imaging there was a minimally displaced fracture involving the left pubic body, extending into the superior ramus, with  an additional fracture of the left pubic root as well, extending into the superior pubic ramus.  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  INTERVAL HISTORY:  [name] is here for routine follow up and states that she has been fairly well. She does note more frequent bowel movements since her surgery. She also notes cold  intolerance since her chemotherapy. Blood counts and chemistries are unremarkable. Her  appetite is good, and she has lost 1 and 1/2 pounds since her last visit.  She denies fever, chills or other signs of infection.  She denies nausea, vomiting, bowel issues, or abdominal pain.  She denies sore throat, cough, dyspnea, or chest pain.  REVIEW OF SYSTEMS:  Review of Systems  Constitutional: Negative.  Negative for appetite change, chills, fatigue, fever and unexpected weight change.       Cold intolerance  HENT:  Negative.    Eyes: Negative.   Respiratory: Negative.  Negative for chest tightness, cough, hemoptysis, shortness of breath and wheezing.   Cardiovascular: Negative.  Negative for chest pain, leg swelling and palpitations.  Gastrointestinal:  Negative for abdominal distention, abdominal pain, blood in stool, constipation, diarrhea, nausea and vomiting.       Frequent bowel movements, chronic since surgery  Endocrine: Negative.   Genitourinary: Negative.  Negative for difficulty urinating, dysuria, frequency and hematuria.   Musculoskeletal: Negative.  Negative for arthralgias, back pain, flank pain, gait problem and myalgias.  Skin: Negative.   Neurological: Negative.  Negative for dizziness, extremity weakness, gait problem, headaches, light-headedness, numbness, seizures and speech difficulty.  Hematological: Negative.   Psychiatric/Behavioral: Negative.  Negative for depression and sleep disturbance. The patient is not nervous/anxious.     VITALS:  Blood pressure (!) 146/63, pulse 64, temperature 98 F (36.7 C), temperature source Oral, resp. rate 18, height 5' 5.5" (1.664 m), weight 130 lb 1.6 oz (59 kg), SpO2 98 %.  Wt Readings from Last 3 Encounters:  05/11/21 130 lb 1.6 oz (59 kg)  11/11/20 133 lb 4 oz (60.4 kg)  11/10/20 131 lb 6.4 oz (59.6 kg)    Body mass index is 21.32 kg/m.  Performance status (ECOG): 0 - Asymptomatic  PHYSICAL EXAM:  Physical  Exam Constitutional:      General: She is not in acute distress.    Appearance: Normal appearance. She is normal weight.  HENT:     Head: Normocephalic and atraumatic.  Eyes:     General: No scleral icterus.    Extraocular Movements: Extraocular movements intact.     Conjunctiva/sclera: Conjunctivae normal.     Pupils: Pupils are equal, round, and reactive to light.  Cardiovascular:     Rate and Rhythm: Normal rate and regular rhythm.     Pulses: Normal pulses.     Heart sounds: Normal heart sounds. No murmur heard.   No friction rub. No gallop.  Pulmonary:     Effort: Pulmonary effort is normal. No respiratory distress.     Breath sounds: Normal breath sounds.  Abdominal:     General: Bowel sounds are normal. There is no distension.     Palpations: Abdomen is soft. There is no hepatomegaly, splenomegaly or mass.     Tenderness: There is no abdominal tenderness.  Musculoskeletal:        General: Normal range of motion.     Cervical back: Normal range of motion and neck supple.     Right lower leg: No edema.     Left lower leg: No edema.  Lymphadenopathy:     Cervical: No cervical adenopathy.  Skin:    General: Skin is warm and dry.  Neurological:     General: No focal deficit present.     Mental Status: She is alert and oriented to person, place, and time. Mental status is at baseline.  Psychiatric:        Mood and Affect: Mood normal.        Behavior: Behavior normal.        Thought Content: Thought content normal.        Judgment: Judgment normal.   LABS:   CBC Latest Ref Rng & Units 05/11/2021 11/10/2020 06/24/2020  WBC - 7.7 6.0 6.3  Hemoglobin 12.0 - 16.0 13.8 12.8 12.8  Hematocrit 36 - 46 41 38 39  Platelets 150 - 399 283 281 238   CMP Latest Ref Rng & Units 05/11/2021 11/10/2020 06/24/2020  Glucose 65 - 99 mg/dL - - -  BUN 4 - 21 13 9 10   Creatinine 0.5 - 1.1 0.6 0.8 0.6  Sodium 137 - 147 141 141 140  Potassium 3.4 - 5.3 3.8 4.3 3.3(A)  Chloride 99 - 108 109(A)  107 109(A)  CO2 13 - 22 26(A) 25(A) 23(A)  Calcium 8.7 - 10.7 9.5 9.5 9.0  Total Protein 6.0 - 8.5 g/dL - - -  Total Bilirubin 0.0 - 1.2 mg/dL - - -  Alkaline Phos 25 - 125 78 70 81  AST 13 - 35 27 27 22   ALT 7 - 35 16 12 12      Lab Results  Component Value Date   CEA1 1.4 11/10/2020   /  CEA  Date Value Ref Range Status  11/10/2020 1.4 0.0 - 4.7 ng/mL Final    Comment:    (NOTE)                             Nonsmokers          <3.9                             Smokers             <5.6 [employer] Electrochemiluminescence Immunoassay (ECLIA) Values obtained with different assay methods or kits cannot be used interchangeably.  Results cannot be interpreted as absolute evidence of the presence or absence of malignant disease. Performed At: [facility] [place], [place] [id number] Rush Farmer MD QG:[id number]      STUDIES:  No results found.   Allergies:  Allergies  Allergen Reactions   Other Anaphylaxis    Hornet sting    Current Medications: Current Outpatient Medications  Medication Sig Dispense Refill   clopidogrel (PLAVIX) 75 MG tablet Take 0.5 tablets (37.5 mg total) by mouth daily. 45 tablet 2   EPINEPHrine 0.3 mg/0.3 mL IJ SOAJ injection Inject into the muscle.     levothyroxine (SYNTHROID) 100 MCG tablet Take 1 tablet (100 mcg total) by mouth daily before breakfast. 90 tablet 2   No current facility-administered medications for this visit.     ASSESSMENT & PLAN:   Assessment:   1. Stage IIIB colon cancer diagnosed in November 2019.  Treated with surgery and adjuvant capecitabine.  She remains without evidence of recurrence.  2. Osteoporosis with history of right hip fracture and now left pubic fracture.  The patient was started on Reclast in late August 2022.  The patient started calcium and vitamin-D and will continue it at least daily. She will be due for repeat bone density in the spring, so we will schedule this  prior to her next appointment.     3. Hypothyroidism but her TSH was in the normal range in August 2022.  Plan: She continues to do fairly well, and is over 3 years out from her diagnosis. We will plan to see her back in 4 months with a CBC, comprehensive metabolic panel, and CEA.  I do recommend repeat colonoscopy with Dr. Noberto Retort as long as she remains healthy, and she will be due in December 2023. The patient understands the plans discussed today and is in agreement with them.  She knows to contact our office if she develops concerns prior to her next appointment.   I provided 20 minutes of face-to-face time during this this encounter and > 50% was spent counseling as documented under my assessment and plan.    Derwood Kaplan, MD [facility] [address] [place] [address] Dept: [phone] Dept Fax: [phone]   I, Rita Ohara, am acting as scribe for Derwood Kaplan, MD  I have reviewed this report as typed by the medical scribe, and it is complete and accurate.

## 2021-05-11 ENCOUNTER — Encounter: Payer: Self-pay | Admitting: Oncology

## 2021-05-11 ENCOUNTER — Telehealth: Payer: Self-pay

## 2021-05-11 ENCOUNTER — Other Ambulatory Visit: Payer: Self-pay

## 2021-05-11 ENCOUNTER — Other Ambulatory Visit: Payer: Self-pay | Admitting: Oncology

## 2021-05-11 ENCOUNTER — Inpatient Hospital Stay (INDEPENDENT_AMBULATORY_CARE_PROVIDER_SITE_OTHER): Payer: Medicare Other | Admitting: Oncology

## 2021-05-11 ENCOUNTER — Inpatient Hospital Stay: Payer: Medicare Other | Attending: Oncology

## 2021-05-11 VITALS — BP 146/63 | HR 64 | Temp 98.0°F | Resp 18 | Ht 65.5 in | Wt 130.1 lb

## 2021-05-11 DIAGNOSIS — E039 Hypothyroidism, unspecified: Secondary | ICD-10-CM | POA: Diagnosis not present

## 2021-05-11 DIAGNOSIS — C182 Malignant neoplasm of ascending colon: Secondary | ICD-10-CM

## 2021-05-11 DIAGNOSIS — M810 Age-related osteoporosis without current pathological fracture: Secondary | ICD-10-CM | POA: Diagnosis not present

## 2021-05-11 DIAGNOSIS — Z85038 Personal history of other malignant neoplasm of large intestine: Secondary | ICD-10-CM | POA: Diagnosis not present

## 2021-05-11 DIAGNOSIS — D649 Anemia, unspecified: Secondary | ICD-10-CM | POA: Diagnosis not present

## 2021-05-11 LAB — CBC AND DIFFERENTIAL
HCT: 41 (ref 36–46)
Hemoglobin: 13.8 (ref 12.0–16.0)
Neutrophils Absolute: 5.01
Platelets: 283 (ref 150–399)
WBC: 7.7

## 2021-05-11 LAB — BASIC METABOLIC PANEL
BUN: 13 (ref 4–21)
CO2: 26 — AB (ref 13–22)
Chloride: 109 — AB (ref 99–108)
Creatinine: 0.6 (ref 0.5–1.1)
Glucose: 100
Potassium: 3.8 (ref 3.4–5.3)
Sodium: 141 (ref 137–147)

## 2021-05-11 LAB — COMPREHENSIVE METABOLIC PANEL
Albumin: 4.5 (ref 3.5–5.0)
Calcium: 9.5 (ref 8.7–10.7)

## 2021-05-11 LAB — HEPATIC FUNCTION PANEL
ALT: 16 (ref 7–35)
AST: 27 (ref 13–35)
Alkaline Phosphatase: 78 (ref 25–125)
Bilirubin, Total: 0.7

## 2021-05-11 LAB — CBC: RBC: 4.52 (ref 3.87–5.11)

## 2021-05-11 NOTE — Telephone Encounter (Signed)
Signora walked in the office this afternoon requesting blood work on her thyroid. She was last seen by Dr. Henrene Pastor on 09/03/2019. I told the patient that she would need to see Dr. Henrene Pastor before we can do the blood work. Patient stated she just needs the blood work done because she just seen Dr. Hinton Rao and she is about out of her thyroid medication. I spoke Marla, CMA who works with Dr. Henrene Pastor, who also stated that she is due for an appointment. Patient was notified that she will have to be seen before the blood work could be done. An appointment was offered to her for right now, patient refused stated that she will just go without medication because someone is waiting on her. I offered an appointment for tomorrow, patient refused stating that she doesn't drive. I told her that would accommodate her for any date that would work best for her, patient refused and left the office.

## 2021-05-12 LAB — CEA: CEA: 1.4 ng/mL (ref 0.0–4.7)

## 2021-05-13 ENCOUNTER — Ambulatory Visit: Payer: Medicare Other | Admitting: Oncology

## 2021-05-13 ENCOUNTER — Other Ambulatory Visit: Payer: Medicare Other

## 2021-05-20 ENCOUNTER — Encounter: Payer: Self-pay | Admitting: Oncology

## 2021-05-23 ENCOUNTER — Telehealth: Payer: Self-pay

## 2021-05-23 NOTE — Telephone Encounter (Signed)
-----   Message from Derwood Kaplan, MD sent at 05/20/2021  7:23 PM EST ----- ?Regarding: call ?Tell her cancer test is normal ? ?

## 2021-05-26 ENCOUNTER — Encounter: Payer: Self-pay | Admitting: Oncology

## 2021-05-26 NOTE — Telephone Encounter (Signed)
-----   Message from Derwood Kaplan, MD sent at 05/20/2021  7:23 PM EST ----- ?Regarding: call ?Tell her cancer test is normal ? ?

## 2021-05-26 NOTE — Telephone Encounter (Signed)
Patient notified

## 2021-09-02 ENCOUNTER — Other Ambulatory Visit: Payer: Self-pay | Admitting: Legal Medicine

## 2021-09-02 DIAGNOSIS — I69323 Fluency disorder following cerebral infarction: Secondary | ICD-10-CM

## 2021-09-07 NOTE — Progress Notes (Deleted)
[place]  [address] [place],  [place]  [address] [phone]  Clinic Day:  09/07/2021  Referring physician: [name],*   This document serves as a record of services personally performed by Hosie Poisson, MD. It was created on their behalf by [facility], a trained medical scribe. The creation of this record is based on the scribe's personal observations and the provider's statements to them.  CHIEF COMPLAINT:  CC: Stage IIIB colon cancer  Current Treatment:  Surveillance  HISTORY OF PRESENT ILLNESS:  [name] is a 87 y.o. female with stage IIIB (T3 N1b M0) colon cancer diagnosed in November 2019.  She had never had a colonoscopy before, but had unintentional weight loss of 35 lb over the last year with decreased appetite.  A stool Hemoccult was positive.  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  The right colon revealed a sessile serrated polyp with an area of low-grade dysplasia as well as a tubular adenoma and small fragments of adenocarcinoma.  The left colon also revealed tubular adenomas and a benign hyperplastic polyp.  Small bowel biopsies were benign.  CT scan showed a few tiny subcentimeter lesions in the left hepatic lobe of low-attenuation which appear most consistent with cysts.  There were multiple multiple small gallstones and a small benign appearing left renal cyst.  However, she had an annular soft tissue mass of the ascending colon with several subcentimeter pericolonic nodes up to 9 mm in diameter which were suspicious for metastatic disease.  She was referred to Dr. Noberto Retort and underwent laparoscopic right hemicolectomy and primary ileocolostomy, as well as a cholecystectomy, in December 2019.  The final pathology revealed 2 separate tumors, 1 measuring 9.5 cm and the other 0.4 cm.  This was a moderately differentiated invasive colorectal  adenocarcinoma with mucinous features.  It extended into the pericolonic connective tissue and the smaller tumor extended into the muscularis propria.  There were some signet ring features and 3/35 nodes were positive for metastasis.  There was lymphovascular invasion.  Margins were clear.  The gallbladder revealed cholelithiasis and chronic cholecystitis.  Postop CEA was 8.3, but it was normal by January 2020.  I recommended adjuvant chemotherapy.  She wanted to take oral chemotherapy, so was placed on capecitabine 1000 mg twice daily in February 2020.  Due to toxicities, we eventually lowered her dose to 1000 mg in the morning and 500 mg in the afternoon.  The iron pills made her diarrhea worse, so that was discontinued.  She completed 6 cycles of capecitabine in June 2020.  She underwent colonoscopy to ileocolic anastomosis in December 2020 with Dr. Noberto Retort. which was negative.  She fell and fractured her right hip at the end of January 2021, and underwent surgery with Dr. Samule Dry on February 1st, 2021.  We saw her for routine follow-up in April and recommended a bone density scan.  This revealed osteoporosis with a T-score of -2 5 in the left femur, as well as a T-score -1.8 the spine.  We discussed treatment options with the patient via telephone and she opted for yearly IV Reclast, which we had planned to give her at her routine follow-up in July.  She was instructed to start calcium and vitamin-D as well.  She presented to emergency department due to another fall and CT imaging there was a minimally displaced fracture involving the left pubic body, extending into the superior ramus, with  an additional fracture of the left pubic root as well, extending into the superior pubic ramus.  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  INTERVAL HISTORY:  [name] is here for routine follow up and states that she has been fairly well. She does note more frequent bowel movements since her surgery. She also notes cold  intolerance since her chemotherapy. Blood counts and chemistries are unremarkable. Her  appetite is good, and she has lost 1 and 1/2 pounds since her last visit.  She denies fever, chills or other signs of infection.  She denies nausea, vomiting, bowel issues, or abdominal pain.  She denies sore throat, cough, dyspnea, or chest pain.  REVIEW OF SYSTEMS:  Review of Systems  Constitutional: Negative.  Negative for appetite change, chills, fatigue, fever and unexpected weight change.       Cold intolerance  HENT:  Negative.    Eyes: Negative.   Respiratory: Negative.  Negative for chest tightness, cough, hemoptysis, shortness of breath and wheezing.   Cardiovascular: Negative.  Negative for chest pain, leg swelling and palpitations.  Gastrointestinal:  Negative for abdominal distention, abdominal pain, blood in stool, constipation, diarrhea, nausea and vomiting.       Frequent bowel movements, chronic since surgery  Endocrine: Negative.   Genitourinary: Negative.  Negative for difficulty urinating, dysuria, frequency and hematuria.   Musculoskeletal: Negative.  Negative for arthralgias, back pain, flank pain, gait problem and myalgias.  Skin: Negative.   Neurological: Negative.  Negative for dizziness, extremity weakness, gait problem, headaches, light-headedness, numbness, seizures and speech difficulty.  Hematological: Negative.   Psychiatric/Behavioral: Negative.  Negative for depression and sleep disturbance. The patient is not nervous/anxious.     VITALS:  There were no vitals taken for this visit.  Wt Readings from Last 3 Encounters:  05/11/21 130 lb 1.6 oz (59 kg)  11/11/20 133 lb 4 oz (60.4 kg)  11/10/20 131 lb 6.4 oz (59.6 kg)    There is no height or weight on file to calculate BMI.  Performance status (ECOG): 0 - Asymptomatic  PHYSICAL EXAM:  Physical Exam Constitutional:      General: She is not in acute distress.    Appearance: Normal appearance. She is normal weight.   HENT:     Head: Normocephalic and atraumatic.  Eyes:     General: No scleral icterus.    Extraocular Movements: Extraocular movements intact.     Conjunctiva/sclera: Conjunctivae normal.     Pupils: Pupils are equal, round, and reactive to light.  Cardiovascular:     Rate and Rhythm: Normal rate and regular rhythm.     Pulses: Normal pulses.     Heart sounds: Normal heart sounds. No murmur heard.    No friction rub. No gallop.  Pulmonary:     Effort: Pulmonary effort is normal. No respiratory distress.     Breath sounds: Normal breath sounds.  Abdominal:     General: Bowel sounds are normal. There is no distension.     Palpations: Abdomen is soft. There is no hepatomegaly, splenomegaly or mass.     Tenderness: There is no abdominal tenderness.  Musculoskeletal:        General: Normal range of motion.     Cervical back: Normal range of motion and neck supple.     Right lower leg: No edema.     Left lower leg: No edema.  Lymphadenopathy:     Cervical: No cervical adenopathy.  Skin:    General: Skin is  warm and dry.  Neurological:     General: No focal deficit present.     Mental Status: She is alert and oriented to person, place, and time. Mental status is at baseline.  Psychiatric:        Mood and Affect: Mood normal.        Behavior: Behavior normal.        Thought Content: Thought content normal.        Judgment: Judgment normal.   LABS:      Latest Ref Rng & Units 05/11/2021   12:00 AM 11/10/2020   12:00 AM 06/24/2020   12:00 AM  CBC  WBC  7.7  6.0  6.3      Hemoglobin 12.0 - 16.0 13.8  12.8  12.8      Hematocrit 36 - 46 41  38  39      Platelets 150 - 399 283  281  238         This result is from an external source.       Latest Ref Rng & Units 05/11/2021   12:00 AM 11/10/2020   12:00 AM 06/24/2020   12:00 AM  CMP  BUN 4 - '21 13  9  10      '$ Creatinine 0.5 - 1.1 0.6  0.8  0.6      Sodium 137 - 147 141  141  140      Potassium 3.4 - 5.3 3.8  4.3  3.3       Chloride 99 - 108 109  107  109      CO2 13 - '22 26  25  23      '$ Calcium 8.7 - 10.7 9.5  9.5  9.0      Alkaline Phos 25 - 125 78  70  81      AST 13 - 35 '27  27  22      '$ ALT 7 - 35 '16  12  12         '$ This result is from an external source.      Lab Results  Component Value Date   CEA1 1.4 05/11/2021   CEA 1.6 06/25/2019   /  CEA  Date Value Ref Range Status  05/11/2021 1.4 0.0 - 4.7 ng/mL Final    Comment:    (NOTE)                             Nonsmokers          <3.9                             Smokers             <5.6 [employer] Electrochemiluminescence Immunoassay (ECLIA) Values obtained with different assay methods or kits cannot be used interchangeably.  Results cannot be interpreted as absolute evidence of the presence or absence of malignant disease. Performed At: [facility] [address], [place] [id number] Rush Farmer MD YO:[id number]   06/25/2019 1.6  Final     STUDIES:  No results found.   Allergies:  Allergies  Allergen Reactions  . Other Anaphylaxis    Hornet sting    Current Medications: Current Outpatient Medications  Medication Sig Dispense Refill  . clopidogrel (PLAVIX) 75 MG tablet Take 0.5 tablets (37.5 mg total) by mouth daily. 45 tablet 2  .  EPINEPHrine 0.3 mg/0.3 mL IJ SOAJ injection Inject into the muscle.    . levothyroxine (SYNTHROID) 100 MCG tablet Take 1 tablet (100 mcg total) by mouth daily before breakfast. 90 tablet 2   No current facility-administered medications for this visit.     ASSESSMENT & PLAN:   Assessment:   1. Stage IIIB colon cancer diagnosed in November 2019.  Treated with surgery and adjuvant capecitabine.  She remains without evidence of recurrence.  2. Osteoporosis with history of right hip fracture and now left pubic fracture.  The patient was started on Reclast in late August 2022.  The patient started calcium and vitamin-D and will continue it at least daily. She will be due  for repeat bone density in the spring, so we will schedule this prior to her next appointment.     3. Hypothyroidism but her TSH was in the normal range in August 2022.  Plan: She continues to do fairly well, and is over 3 years out from her diagnosis. We will plan to see her back in 4 months with a CBC, comprehensive metabolic panel, and CEA.  I do recommend repeat colonoscopy with Dr. Noberto Retort as long as she remains healthy, and she will be due in December 2023. The patient understands the plans discussed today and is in agreement with them.  She knows to contact our office if she develops concerns prior to her next appointment.   I provided 20 minutes of face-to-face time during this this encounter and > 50% was spent counseling as documented under my assessment and plan.    Derwood Kaplan, MD [facility] AT [facility] [address] [place] [address] Dept: [phone] Dept Fax: [phone]   I, Rita Ohara, am acting as scribe for Derwood Kaplan, MD  I have reviewed this report as typed by the medical scribe, and it is complete and accurate.

## 2021-09-08 ENCOUNTER — Ambulatory Visit: Payer: Medicare Other | Admitting: Oncology

## 2021-09-08 ENCOUNTER — Other Ambulatory Visit: Payer: Medicare Other

## 2021-09-08 ENCOUNTER — Telehealth: Payer: Self-pay

## 2021-09-08 DIAGNOSIS — C182 Malignant neoplasm of ascending colon: Secondary | ICD-10-CM

## 2021-09-08 NOTE — Telephone Encounter (Signed)
-----   Message from Derwood Kaplan, MD sent at 09/07/2021  6:41 PM EDT ----- Regarding: dexa Did  she get DEXA

## 2021-09-08 NOTE — Telephone Encounter (Signed)
DEXA scan did not get scheduled. Denise in scheduling notified. Will send up order to get scheduled.

## 2021-09-21 NOTE — Progress Notes (Signed)
[place]  [address] [place],  [place]  [address] [phone]  Clinic Day: 09/22/21  Referring physician: [name],*   CHIEF COMPLAINT:  CC: Stage IIIB colon cancer  Current Treatment:  Surveillance  HISTORY OF PRESENT ILLNESS:  [name] is a 87 y.o. female with stage IIIB (T3 N1b M0) colon cancer diagnosed in November 2019.  She had never had a colonoscopy before, but had unintentional weight loss of 35 lb over the last year with decreased appetite.  A stool Hemoccult was positive.  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  The right colon revealed a sessile serrated polyp with an area of low-grade dysplasia as well as a tubular adenoma and small fragments of adenocarcinoma.  The left colon also revealed tubular adenomas and a benign hyperplastic polyp.  Small bowel biopsies were benign.  CT scan showed a few tiny subcentimeter lesions in the left hepatic lobe of low-attenuation which appear most consistent with cysts.  There were multiple multiple small gallstones and a small benign appearing left renal cyst.  However, she had an annular soft tissue mass of the ascending colon with several subcentimeter pericolonic nodes up to 9 mm in diameter which were suspicious for metastatic disease.  She was referred to Dr. Noberto Retort and underwent laparoscopic right hemicolectomy and primary ileocolostomy, as well as a cholecystectomy, in December 2019.  The final pathology revealed 2 separate tumors, 1 measuring 9.5 cm and the other 0.4 cm.  This was a moderately differentiated invasive colorectal adenocarcinoma with mucinous features.  It extended into the pericolonic connective tissue and the smaller tumor extended into the muscularis propria.  There were some signet ring features and 3/35 nodes were positive for metastasis.  There was lymphovascular invasion.  Margins were clear.  The  gallbladder revealed cholelithiasis and chronic cholecystitis.  Postop CEA was 8.3, but it was normal by January 2020.  I recommended adjuvant chemotherapy.  She wanted to take oral chemotherapy, so was placed on capecitabine 1000 mg twice daily in February 2020.  Due to toxicities, we eventually lowered her dose to 1000 mg in the morning and 500 mg in the afternoon.  The iron pills made her diarrhea worse, so that was discontinued.  She completed 6 cycles of capecitabine in June 2020.  She underwent colonoscopy to ileocolic anastomosis in December 2020 with Dr. Noberto Retort. which was negative.  She fell and fractured her right hip at the end of January 2021, and underwent surgery with Dr. Samule Dry on February 1st, 2021.  We saw her for routine follow-up in April and recommended a bone density scan.  This revealed osteoporosis with a T-score of -2 5 in the left femur, as well as a T-score -1.8 the spine.  We discussed treatment options with the patient via telephone and she opted for yearly IV Reclast, which we had planned to give her at her routine follow-up in July.  She was instructed to start calcium and vitamin-D as well.  She presented to emergency department due to another fall and CT imaging there was a minimally displaced fracture involving the left pubic body, extending into the superior ramus, with an additional fracture of the left pubic root as well, extending into the superior pubic ramus.  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  INTERVAL HISTORY:  [name] is here for routine follow up and states that she has been fairly well.  She did have a fracture of the pubic ramus in the past and continues to complain of pain of the back and hip, especially when moving around.  She will go back to her orthopedic doctor in [place].  She is on Reclast once yearly for her osteoporosis and is due on August 25.  Since her labs look so good, I do not think they need to be repeated at that time.  CBC and  comprehensive metabolic profile are all normal including a calcium of 9.5.  Her  appetite is good, and her weight is stable since her last visit.  She denies fever, chills or other signs of infection.  She denies nausea, vomiting, bowel issues, or abdominal pain.  She denies sore throat, cough, dyspnea, or chest pain.  REVIEW OF SYSTEMS:  Review of Systems  Constitutional: Negative.  Negative for appetite change, chills, fatigue, fever and unexpected weight change.       Cold intolerance  HENT:  Negative.    Eyes: Negative.   Respiratory: Negative.  Negative for chest tightness, cough, hemoptysis, shortness of breath and wheezing.   Cardiovascular: Negative.  Negative for chest pain, leg swelling and palpitations.  Gastrointestinal:  Negative for abdominal distention, abdominal pain, blood in stool, constipation, diarrhea, nausea and vomiting.       Frequent bowel movements, chronic since surgery  Endocrine: Negative.   Genitourinary: Negative.  Negative for difficulty urinating, dysuria, frequency and hematuria.   Musculoskeletal: Negative.  Negative for arthralgias, back pain, flank pain, gait problem and myalgias.  Skin: Negative.   Neurological: Negative.  Negative for dizziness, extremity weakness, gait problem, headaches, light-headedness, numbness, seizures and speech difficulty.  Hematological: Negative.   Psychiatric/Behavioral: Negative.  Negative for depression and sleep disturbance. The patient is not nervous/anxious.      VITALS:  Blood pressure (!) 146/67, pulse 66, temperature 97.6 F (36.4 C), temperature source Oral, resp. rate 18, height 5' 5.5" (1.664 m), weight 129 lb 14.4 oz (58.9 kg), SpO2 98 %.  Wt Readings from Last 3 Encounters:  09/22/21 129 lb 14.4 oz (58.9 kg)  05/11/21 130 lb 1.6 oz (59 kg)  11/11/20 133 lb 4 oz (60.4 kg)    Body mass index is 21.29 kg/m.  Performance status (ECOG): 0 - Asymptomatic  PHYSICAL EXAM:  Physical Exam Constitutional:       General: She is not in acute distress.    Appearance: Normal appearance. She is normal weight.  HENT:     Head: Normocephalic and atraumatic.  Eyes:     General: No scleral icterus.    Extraocular Movements: Extraocular movements intact.     Conjunctiva/sclera: Conjunctivae normal.     Pupils: Pupils are equal, round, and reactive to light.  Cardiovascular:     Rate and Rhythm: Normal rate and regular rhythm.     Pulses: Normal pulses.     Heart sounds: Normal heart sounds. No murmur heard.    No friction rub. No gallop.  Pulmonary:     Effort: Pulmonary effort is normal. No respiratory distress.     Breath sounds: Normal breath sounds.  Abdominal:     General: Bowel sounds are normal. There is no distension.     Palpations: Abdomen is soft. There is no hepatomegaly, splenomegaly or mass.     Tenderness: There is no abdominal tenderness.  Musculoskeletal:        General: Normal range of motion.     Cervical back: Normal range of motion and neck supple.  Right lower leg: No edema.     Left lower leg: No edema.  Lymphadenopathy:     Cervical: No cervical adenopathy.  Skin:    General: Skin is warm and dry.  Neurological:     General: No focal deficit present.     Mental Status: She is alert and oriented to person, place, and time. Mental status is at baseline.  Psychiatric:        Mood and Affect: Mood normal.        Behavior: Behavior normal.        Thought Content: Thought content normal.        Judgment: Judgment normal.    LABS:      Latest Ref Rng & Units 09/22/2021   12:00 AM 05/11/2021   12:00 AM 11/10/2020   12:00 AM  CBC  WBC  7.5     7.7  6.0   Hemoglobin 12.0 - 16.0 13.5     13.8  12.8   Hematocrit 36 - 46 40     41  38   Platelets 150 - 400 K/uL 289     283  281      This result is from an external source.      Latest Ref Rng & Units 09/22/2021   12:00 AM 05/11/2021   12:00 AM 11/10/2020   12:00 AM  CMP  BUN 4 - '21 6     13  9   '$ Creatinine 0.5 - 1.1  0.7     0.6  0.8   Sodium 137 - 147 140     141  141   Potassium 3.5 - 5.1 mEq/L 3.8     3.8  4.3   Chloride 99 - 108 108     109  107   CO2 13 - '22 26     26  25   '$ Calcium 8.7 - 10.7 9.5     9.5  9.5   Alkaline Phos 25 - 125 72     78  70   AST 13 - 35 '25     27  27   '$ ALT 7 - 35 U/L '15     16  12      '$ This result is from an external source.     Lab Results  Component Value Date   CEA1 1.7 09/22/2021   CEA 1.6 06/25/2019   /  CEA  Date Value Ref Range Status  09/22/2021 1.7 0.0 - 4.7 ng/mL Final    Comment:    (NOTE)                             Nonsmokers          <3.9                             Smokers             <5.6 [employer] Electrochemiluminescence Immunoassay (ECLIA) Values obtained with different assay methods or kits cannot be used interchangeably.  Results cannot be interpreted as absolute evidence of the presence or absence of malignant disease. Performed At: [facility] [place], [place] [id number] Rush Farmer MD [id number]   06/25/2019 1.6  Final     STUDIES:  No results found.   Allergies:  Allergies  Allergen Reactions   Other Anaphylaxis    Hornet sting  Current Medications: Current Outpatient Medications  Medication Sig Dispense Refill   clopidogrel (PLAVIX) 75 MG tablet Take 0.5 tablets (37.5 mg total) by mouth daily. 45 tablet 2   EPINEPHrine 0.3 mg/0.3 mL IJ SOAJ injection Inject into the muscle.     levothyroxine (SYNTHROID) 100 MCG tablet Take 1 tablet (100 mcg total) by mouth daily before breakfast. 90 tablet 2   No current facility-administered medications for this visit.     ASSESSMENT & PLAN:   Assessment:   1. Stage IIIB colon cancer diagnosed in November 2019.  She was treated with surgery and adjuvant capecitabine.  She remains without evidence of recurrence.  2. Osteoporosis with history of right hip fracture and now left pubic fracture.  The patient was started on Reclast in late  August 2022, and will be due for her second dose on August 25.  The patient started calcium and vitamin-D and will continue it at least daily. She will be due for repeat bone density.  3. Hypothyroidism but her TSH was in the normal range in August 2022.  Plan: She continues to do fairly well, and is over 3 1/2 years out from her diagnosis. We will plan to see her back in 4 months with a CBC, comprehensive metabolic panel, and CEA.  I do recommend repeat colonoscopy with Dr. Noberto Retort as long as she remains healthy, and she will be due in December 2023. The patient understands the plans discussed today and is in agreement with them.  She knows to contact our office if she develops concerns prior to her next appointment.   I provided 20 minutes of face-to-face time during this this encounter and > 50% was spent counseling as documented under my assessment and plan.    Derwood Kaplan, MD [facility] AT [facility] [address] [place] [address] Dept: [phone] Dept Fax: [phone]

## 2021-09-22 ENCOUNTER — Other Ambulatory Visit: Payer: Self-pay | Admitting: Oncology

## 2021-09-22 ENCOUNTER — Encounter: Payer: Self-pay | Admitting: Oncology

## 2021-09-22 ENCOUNTER — Inpatient Hospital Stay: Payer: Medicare Other | Admitting: Oncology

## 2021-09-22 ENCOUNTER — Inpatient Hospital Stay: Payer: Medicare Other | Attending: Oncology

## 2021-09-22 VITALS — BP 146/67 | HR 66 | Temp 97.6°F | Resp 18 | Ht 65.5 in | Wt 129.9 lb

## 2021-09-22 DIAGNOSIS — M810 Age-related osteoporosis without current pathological fracture: Secondary | ICD-10-CM | POA: Insufficient documentation

## 2021-09-22 DIAGNOSIS — Z85038 Personal history of other malignant neoplasm of large intestine: Secondary | ICD-10-CM | POA: Diagnosis not present

## 2021-09-22 DIAGNOSIS — C182 Malignant neoplasm of ascending colon: Secondary | ICD-10-CM

## 2021-09-22 DIAGNOSIS — D126 Benign neoplasm of colon, unspecified: Secondary | ICD-10-CM | POA: Diagnosis not present

## 2021-09-22 DIAGNOSIS — D649 Anemia, unspecified: Secondary | ICD-10-CM | POA: Diagnosis not present

## 2021-09-22 LAB — CBC AND DIFFERENTIAL
HCT: 40 (ref 36–46)
Hemoglobin: 13.5 (ref 12.0–16.0)
Neutrophils Absolute: 4.28
Platelets: 289 10*3/uL (ref 150–400)
WBC: 7.5

## 2021-09-22 LAB — HEPATIC FUNCTION PANEL
ALT: 15 U/L (ref 7–35)
AST: 25 (ref 13–35)
Alkaline Phosphatase: 72 (ref 25–125)
Bilirubin, Total: 0.7

## 2021-09-22 LAB — CBC: RBC: 4.45 (ref 3.87–5.11)

## 2021-09-22 LAB — BASIC METABOLIC PANEL
BUN: 6 (ref 4–21)
CO2: 26 — AB (ref 13–22)
Chloride: 108 (ref 99–108)
Creatinine: 0.7 (ref 0.5–1.1)
Glucose: 108
Potassium: 3.8 mEq/L (ref 3.5–5.1)
Sodium: 140 (ref 137–147)

## 2021-09-22 LAB — COMPREHENSIVE METABOLIC PANEL
Albumin: 4.4 (ref 3.5–5.0)
Calcium: 9.5 (ref 8.7–10.7)

## 2021-09-23 LAB — CEA: CEA: 1.7 ng/mL (ref 0.0–4.7)

## 2021-09-26 ENCOUNTER — Telehealth: Payer: Self-pay

## 2021-09-26 NOTE — Telephone Encounter (Signed)
-----   Message from Derwood Kaplan, MD sent at 09/24/2021  2:14 PM EDT ----- Regarding: call Can tell her cancer marker remains normal

## 2021-09-27 ENCOUNTER — Encounter: Payer: Self-pay | Admitting: Oncology

## 2021-09-28 ENCOUNTER — Telehealth: Payer: Self-pay

## 2021-09-28 NOTE — Telephone Encounter (Signed)
-----   Message from Derwood Kaplan, MD sent at 09/24/2021  2:14 PM EDT ----- Regarding: call Can tell her cancer marker remains normal

## 2021-10-10 ENCOUNTER — Encounter: Payer: Self-pay | Admitting: Oncology

## 2021-10-17 DIAGNOSIS — Z85038 Personal history of other malignant neoplasm of large intestine: Secondary | ICD-10-CM | POA: Diagnosis not present

## 2021-10-17 DIAGNOSIS — E079 Disorder of thyroid, unspecified: Secondary | ICD-10-CM | POA: Diagnosis not present

## 2021-10-17 DIAGNOSIS — M25551 Pain in right hip: Secondary | ICD-10-CM | POA: Diagnosis not present

## 2021-10-17 DIAGNOSIS — M810 Age-related osteoporosis without current pathological fracture: Secondary | ICD-10-CM | POA: Diagnosis not present

## 2021-10-17 DIAGNOSIS — E7800 Pure hypercholesterolemia, unspecified: Secondary | ICD-10-CM | POA: Diagnosis not present

## 2021-10-24 ENCOUNTER — Encounter: Payer: Self-pay | Admitting: Oncology

## 2021-10-25 ENCOUNTER — Telehealth: Payer: Self-pay

## 2021-10-25 NOTE — Telephone Encounter (Signed)
Do not see where patient got DEXA scan done.

## 2021-10-25 NOTE — Telephone Encounter (Signed)
-----   Message from Derwood Kaplan, MD sent at 10/24/2021  6:45 PM EDT ----- Regarding: DEXA Did she ever get her DEXA this year?

## 2021-11-14 ENCOUNTER — Inpatient Hospital Stay: Payer: Medicare Other | Attending: Oncology

## 2021-11-14 VITALS — BP 137/73 | HR 86 | Temp 98.3°F | Resp 18 | Ht 65.5 in | Wt 127.1 lb

## 2021-11-14 DIAGNOSIS — M810 Age-related osteoporosis without current pathological fracture: Secondary | ICD-10-CM | POA: Diagnosis not present

## 2021-11-14 MED ORDER — ZOLEDRONIC ACID 4 MG/100ML IV SOLN
4.0000 mg | Freq: Once | INTRAVENOUS | Status: AC
  Administered 2021-11-14: 4 mg via INTRAVENOUS
  Filled 2021-11-14: qty 100

## 2021-11-14 MED ORDER — SODIUM CHLORIDE 0.9 % IV SOLN: Freq: Once | INTRAVENOUS | Status: AC

## 2021-11-14 NOTE — Patient Instructions (Signed)

## 2021-11-15 ENCOUNTER — Encounter: Payer: Self-pay | Admitting: Oncology

## 2021-11-28 NOTE — Progress Notes (Unsigned)
[facility] [place] Phone: [phone] Subjective:   [name], am serving as a scribe for Dr. Hulan Saas.   I'm seeing this patient by the request  of:  [name], MD  CC: right hip pain   [id number]  Novi Calia is a 87 y.o. female coming in with complaint of right hip pain Fractured hip 2 years ago, Feb 2021. Pain is over lateral aspect of hip. Painful with movements and sitting down for long periods of time. Pain can radiate down to her knee.   Past medical history significant for malignant neoplasm of the colon.  Past medical history significant for osteoporosis as well. On reclast     Past Medical History:  Diagnosis Date   Hypothyroidism    Osteoporosis with fracture 05/03/2019   S/P right hip fracture 05/02/2019   Past Surgical History:  Procedure Laterality Date   ABDOMINAL HYSTERECTOMY     w/ bilateral salpingo oophorectomy   APPENDECTOMY     CHOLECYSTECTOMY     COLON SURGERY  2020   right hemicolectomy   REPLACEMENT TOTAL KNEE BILATERAL     TONSILLECTOMY     Social History   Socioeconomic History   Marital status: Widowed    Spouse name: Not on file   Number of children: 0   Years of education: Not on file   Highest education level: Not on file  Occupational History   Not on file  Tobacco Use   Smoking status: Never   Smokeless tobacco: Never  Substance and Sexual Activity   Alcohol use: Never    Alcohol/week: 0.0 standard drinks of alcohol   Drug use: Never   Sexual activity: Not Currently  Other Topics Concern   Not on file  Social History Narrative   Not on file   Social Determinants of Health   Financial Resource Strain: Not on file  Food Insecurity: Not on file  Transportation Needs: Not on file  Physical Activity: Not on file  Stress: Not on file  Social Connections: Not on file   Allergies  Allergen Reactions   Other Anaphylaxis    Hornet  sting   No family history on file.  Current Outpatient Medications (Endocrine & Metabolic):    levothyroxine (SYNTHROID) 100 MCG tablet, Take 1 tablet (100 mcg total) by mouth daily before breakfast.  Current Outpatient Medications (Cardiovascular):    EPINEPHrine 0.3 mg/0.3 mL IJ SOAJ injection, Inject into the muscle.    Current Outpatient Medications (Hematological):    clopidogrel (PLAVIX) 75 MG tablet, Take 0.5 tablets (37.5 mg total) by mouth daily.    Reviewed prior external information including notes and imaging from  primary care provider As well as notes that were available from care everywhere and other healthcare systems.  Past medical history, social, surgical and family history all reviewed in electronic medical record.  No pertanent information unless stated regarding to the chief complaint.   Review of Systems:  No headache, visual changes, nausea, vomiting, diarrhea, constipation, dizziness, abdominal pain, skin rash, fevers, chills, night sweats, weight loss, swollen lymph nodes, body aches, joint swelling, chest pain, shortness of breath, mood changes. POSITIVE muscle aches  Objective  Blood pressure 138/82, pulse 82, height 5' 5.5" (1.664 m), SpO2 97 %.   General: No apparent distress alert a but not oriented mood and affect normal, dressed appropriately.  HEENT: Pupils equal, extraocular movements intact  Respiratory: Patient's speak in full sentences and  does not appear short of breath  Cardiovascular: No lower extremity edema, non tender, no erythema  Ttp over the gt area on the right over previous surgical area  Positive faber    Procedure: Real-time Ultrasound Guided Injection of right greater trochanteric bursitis secondary to patient's body habitus Device: GE Logiq Q7 Ultrasound guided injection is preferred based studies that show increased duration, increased effect, greater accuracy, decreased procedural pain, increased response rate, and  decreased cost with ultrasound guided versus blind injection.  Verbal informed consent obtained.  Time-out conducted.  Noted no overlying erythema, induration, or other signs of local infection.  Skin prepped in a sterile fashion.  Local anesthesia: Topical Ethyl chloride.  With sterile technique and under real time ultrasound guidance:  Greater trochanteric area was visualized and patient's bursa was noted. A 22-gauge 3 inch needle was inserted and 4 cc of 0.5% Marcaine and 1 cc of Kenalog 40 mg/dL was injected. Pictures taken Completed without difficulty  Pain immediately resolved suggesting accurate placement of the medication.  Advised to call if fevers/chills, erythema, induration, drainage, or persistent bleeding.  Impression: Technically successful ultrasound guided injection.   Impression and Recommendations:    The above documentation has been reviewed and is accurate and complete Lyndal Pulley, DO

## 2021-11-29 ENCOUNTER — Encounter: Payer: Self-pay | Admitting: Family Medicine

## 2021-11-29 ENCOUNTER — Ambulatory Visit: Payer: Medicare Other | Admitting: Family Medicine

## 2021-11-29 ENCOUNTER — Ambulatory Visit (INDEPENDENT_AMBULATORY_CARE_PROVIDER_SITE_OTHER): Payer: Medicare Other

## 2021-11-29 ENCOUNTER — Ambulatory Visit: Payer: Self-pay

## 2021-11-29 VITALS — BP 138/82 | HR 82 | Ht 65.5 in

## 2021-11-29 DIAGNOSIS — M1611 Unilateral primary osteoarthritis, right hip: Secondary | ICD-10-CM | POA: Diagnosis not present

## 2021-11-29 DIAGNOSIS — M25551 Pain in right hip: Secondary | ICD-10-CM | POA: Diagnosis not present

## 2021-11-29 NOTE — Assessment & Plan Note (Signed)
Patient given injection today.  We will get x-rays with patient undergoing the malignant lung cancer treatment at the moment.  We discussed that there is a possibility for a stress reaction.  Patient though did have some atrophy of this musculature in this area from patient's previous fracture and IM rod previously.  Do not see any true masses on the x-ray but over waiting read Discussed with patient about different exercises that could be beneficial as well.  Just trying to help patient's quality of life at the moment.  Follow-up again in 6 to 8 weeks.

## 2021-11-29 NOTE — Patient Instructions (Addendum)
Injected GT today Exercises 3x a week  Can repeat injection in 3 months See me again in 2-3 months

## 2022-01-18 DIAGNOSIS — Z9181 History of falling: Secondary | ICD-10-CM | POA: Diagnosis not present

## 2022-01-18 DIAGNOSIS — Z85038 Personal history of other malignant neoplasm of large intestine: Secondary | ICD-10-CM | POA: Diagnosis not present

## 2022-01-18 DIAGNOSIS — E559 Vitamin D deficiency, unspecified: Secondary | ICD-10-CM | POA: Diagnosis not present

## 2022-01-18 DIAGNOSIS — M810 Age-related osteoporosis without current pathological fracture: Secondary | ICD-10-CM | POA: Diagnosis not present

## 2022-01-23 ENCOUNTER — Inpatient Hospital Stay: Payer: Medicare Other | Attending: Oncology | Admitting: Oncology

## 2022-01-23 ENCOUNTER — Other Ambulatory Visit: Payer: Self-pay | Admitting: Oncology

## 2022-01-23 ENCOUNTER — Encounter: Payer: Self-pay | Admitting: Oncology

## 2022-01-23 ENCOUNTER — Telehealth: Payer: Self-pay

## 2022-01-23 ENCOUNTER — Inpatient Hospital Stay (INDEPENDENT_AMBULATORY_CARE_PROVIDER_SITE_OTHER): Payer: Medicare Other

## 2022-01-23 VITALS — BP 130/74 | HR 67 | Temp 97.5°F | Resp 18 | Ht 65.5 in | Wt 128.5 lb

## 2022-01-23 DIAGNOSIS — C182 Malignant neoplasm of ascending colon: Secondary | ICD-10-CM | POA: Diagnosis not present

## 2022-01-23 DIAGNOSIS — Z85038 Personal history of other malignant neoplasm of large intestine: Secondary | ICD-10-CM | POA: Diagnosis not present

## 2022-01-23 DIAGNOSIS — M810 Age-related osteoporosis without current pathological fracture: Secondary | ICD-10-CM

## 2022-01-23 LAB — CBC WITH DIFFERENTIAL (CANCER CENTER ONLY)
Abs Immature Granulocytes: 0.1 10*3/uL — ABNORMAL HIGH (ref 0.00–0.07)
Basophils Absolute: 0.1 10*3/uL (ref 0.0–0.1)
Basophils Relative: 1 %
Eosinophils Absolute: 0.2 10*3/uL (ref 0.0–0.5)
Eosinophils Relative: 2 %
HCT: 43 % (ref 36.0–46.0)
Hemoglobin: 13.9 g/dL (ref 12.0–15.0)
Immature Granulocytes: 1 %
Lymphocytes Relative: 27 %
Lymphs Abs: 2 10*3/uL (ref 0.7–4.0)
MCH: 30.2 pg (ref 26.0–34.0)
MCHC: 32.3 g/dL (ref 30.0–36.0)
MCV: 93.3 fL (ref 80.0–100.0)
Monocytes Absolute: 0.7 10*3/uL (ref 0.1–1.0)
Monocytes Relative: 9 %
Neutro Abs: 4.5 10*3/uL (ref 1.7–7.7)
Neutrophils Relative %: 60 %
Platelet Count: 279 10*3/uL (ref 150–400)
RBC: 4.61 MIL/uL (ref 3.87–5.11)
RDW: 13.8 % (ref 11.5–15.5)
WBC Count: 7.5 10*3/uL (ref 4.0–10.5)
nRBC: 0 % (ref 0.0–0.2)

## 2022-01-23 LAB — CMP (CANCER CENTER ONLY)
ALT: 13 U/L (ref 0–44)
AST: 17 U/L (ref 15–41)
Albumin: 4.1 g/dL (ref 3.5–5.0)
Alkaline Phosphatase: 54 U/L (ref 38–126)
Anion gap: 8 (ref 5–15)
BUN: 10 mg/dL (ref 8–23)
CO2: 25 mmol/L (ref 22–32)
Calcium: 9 mg/dL (ref 8.9–10.3)
Chloride: 106 mmol/L (ref 98–111)
Creatinine: 0.79 mg/dL (ref 0.44–1.00)
GFR, Estimated: >60 mL/min (ref >60)
Glucose, Bld: 96 mg/dL (ref 70–99)
Potassium: 4 mmol/L (ref 3.5–5.1)
Sodium: 139 mmol/L (ref 135–145)
Total Bilirubin: 0.4 mg/dL (ref 0.3–1.2)
Total Protein: 7 g/dL (ref 6.5–8.1)

## 2022-01-23 NOTE — Telephone Encounter (Signed)
Can not find results in Epic or [employer]. Will ask patient if she had this done.

## 2022-01-23 NOTE — Progress Notes (Signed)
[facility]  [address] [place],  [place]  [address] [phone]  Clinic Day: 01/23/22  Referring physician: [name],*   CHIEF COMPLAINT:  CC: Stage IIIB colon cancer  Current Treatment:  Surveillance  HISTORY OF PRESENT ILLNESS:  [name] is a 87 y.o. female with stage IIIB (T3 N1b M0) colon cancer diagnosed in November 2019.  She had never had a colonoscopy before, but had unintentional weight loss of 35 lb over the last year with decreased appetite.  A stool Hemoccult was positive.  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  The right colon revealed a sessile serrated polyp with an area of low-grade dysplasia as well as a tubular adenoma and small fragments of adenocarcinoma.  The left colon also revealed tubular adenomas and a benign hyperplastic polyp.  Small bowel biopsies were benign.  CT scan showed a few tiny subcentimeter lesions in the left hepatic lobe of low-attenuation which appear most consistent with cysts.  There were multiple multiple small gallstones and a small benign appearing left renal cyst.  However, she had an annular soft tissue mass of the ascending colon with several subcentimeter pericolonic nodes up to 9 mm in diameter which were suspicious for metastatic disease.  She was referred to Dr. Noberto Retort and underwent laparoscopic right hemicolectomy and primary ileocolostomy, as well as a cholecystectomy, in December 2019.  The final pathology revealed 2 separate tumors, 1 measuring 9.5 cm and the other 0.4 cm.  This was a moderately differentiated invasive colorectal adenocarcinoma with mucinous features.  It extended into the pericolonic connective tissue and the smaller tumor extended into the muscularis propria.  There were some signet ring features and 3/35 nodes were positive for metastasis.  There was lymphovascular invasion.  Margins were clear.  The  gallbladder revealed cholelithiasis and chronic cholecystitis.  Postop CEA was 8.3, but it was normal by January 2020.  I recommended adjuvant chemotherapy.  She wanted to take oral chemotherapy, so was placed on capecitabine 1000 mg twice daily in February 2020.  Due to toxicities, we eventually lowered her dose to 1000 mg in the morning and 500 mg in the afternoon.  The iron pills made her diarrhea worse, so that was discontinued.  She completed 6 cycles of capecitabine in June 2020.  She underwent colonoscopy to ileocolic anastomosis in December 2020 with Dr. Noberto Retort. which was negative.  She fell and fractured her right hip at the end of January 2021, and underwent surgery with Dr. Samule Dry on February 1st, 2021.  We saw her for routine follow-up in April and recommended a bone density scan.  This revealed osteoporosis with a T-score of -2 5 in the left femur, as well as a T-score -1.8 the spine.  We discussed treatment options with the patient via telephone and she opted for yearly IV Reclast, which we had planned to give her at her routine follow-up in July.  She was instructed to start calcium and vitamin-D as well.  She presented to emergency department due to another fall and CT imaging there was a minimally displaced fracture involving the left pubic body, extending into the superior ramus, with an additional fracture of the left pubic root as well, extending into the superior pubic ramus.  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  INTERVAL HISTORY:  [name] is here for routine follow up and states that she has been fairly well.  She did have a fracture of the pubic ramus in the past and continues to complain of pain of the back and hip, especially when moving around. She is on Reclast once yearly for her osteoporosis. Her last DEXA was 07/09/19 so she is due this year, and I will get that scheduled. She has a new PCP in [place] and she put her back on iron. Her hemoglobin is 13.9 with an MCV of 93  and her CMP and CEA are all normal. She is due for colonoscopy. Her  appetite is good, and her weight is stable since her last visit.  She denies fever, chills or other signs of infection.  She denies nausea, vomiting, bowel issues, or abdominal pain.  She denies sore throat, cough, dyspnea, or chest pain.  REVIEW OF SYSTEMS:  Review of Systems  Constitutional: Negative.  Negative for appetite change, chills, fatigue, fever and unexpected weight change.       Cold intolerance  HENT:  Negative.    Eyes: Negative.   Respiratory: Negative.  Negative for chest tightness, cough, hemoptysis, shortness of breath and wheezing.   Cardiovascular: Negative.  Negative for chest pain, leg swelling and palpitations.  Gastrointestinal: Negative.  Negative for abdominal distention, abdominal pain, blood in stool, constipation, diarrhea, nausea and vomiting.       Frequent bowel movements, chronic since surgery  Endocrine: Negative.   Genitourinary: Negative.  Negative for difficulty urinating, dysuria, frequency and hematuria.   Musculoskeletal: Negative.  Negative for arthralgias, back pain, flank pain, gait problem and myalgias.  Skin: Negative.   Neurological: Negative.  Negative for dizziness, extremity weakness, gait problem, headaches, light-headedness, numbness, seizures and speech difficulty.  Hematological: Negative.   Psychiatric/Behavioral: Negative.  Negative for depression and sleep disturbance. The patient is not nervous/anxious.      VITALS:  Blood pressure 130/74, pulse 67, temperature (!) 97.5 F (36.4 C), temperature source Oral, resp. rate 18, height 5' 5.5" (1.664 m), weight 128 lb 8 oz (58.3 kg), SpO2 96 %.  Wt Readings from Last 3 Encounters:  01/23/22 128 lb 8 oz (58.3 kg)  11/14/21 127 lb 1.9 oz (57.7 kg)  09/22/21 129 lb 14.4 oz (58.9 kg)    Body mass index is 21.06 kg/m.  Performance status (ECOG): 0 - Asymptomatic  PHYSICAL EXAM:  Physical Exam Constitutional:       General: She is not in acute distress.    Appearance: Normal appearance. She is normal weight.  HENT:     Head: Normocephalic and atraumatic.  Eyes:     General: No scleral icterus.    Extraocular Movements: Extraocular movements intact.     Conjunctiva/sclera: Conjunctivae normal.     Pupils: Pupils are equal, round, and reactive to light.  Cardiovascular:     Rate and Rhythm: Normal rate and regular rhythm.     Pulses: Normal pulses.     Heart sounds: Normal heart sounds. No murmur heard.    No friction rub. No gallop.  Pulmonary:     Effort: Pulmonary effort is normal. No respiratory distress.     Breath sounds: Normal breath sounds.  Abdominal:     General: Bowel sounds are normal. There is no distension.     Palpations: Abdomen is soft. There is no hepatomegaly, splenomegaly or mass.     Tenderness: There is no abdominal tenderness.  Musculoskeletal:        General: Normal range of motion.     Cervical back: Normal range of motion and neck  supple.     Right lower leg: No edema.     Left lower leg: No edema.  Lymphadenopathy:     Cervical: No cervical adenopathy.  Skin:    General: Skin is warm and dry.  Neurological:     General: No focal deficit present.     Mental Status: She is alert and oriented to person, place, and time. Mental status is at baseline.  Psychiatric:        Mood and Affect: Mood normal.        Behavior: Behavior normal.        Thought Content: Thought content normal.        Judgment: Judgment normal.   LABS:      Latest Ref Rng & Units 01/23/2022    1:03 PM 09/22/2021   12:00 AM 05/11/2021   12:00 AM  CBC  WBC 4.0 - 10.5 K/uL 7.5  7.5     7.7   Hemoglobin 12.0 - 15.0 g/dL 13.9  13.5     13.8   Hematocrit 36.0 - 46.0 % 43.0  40     41   Platelets 150 - 400 K/uL 279  289     283      This result is from an external source.      Latest Ref Rng & Units 01/23/2022    1:03 PM 09/22/2021   12:00 AM 05/11/2021   12:00 AM  CMP  Glucose 70 - 99 mg/dL  96     BUN 8 - 23 mg/dL '10  6     13   '$ Creatinine 0.44 - 1.00 mg/dL 0.79  0.7     0.6   Sodium 135 - 145 mmol/L 139  140     141   Potassium 3.5 - 5.1 mmol/L 4.0  3.8     3.8   Chloride 98 - 111 mmol/L 106  108     109   CO2 22 - 32 mmol/L '25  26     26   '$ Calcium 8.9 - 10.3 mg/dL 9.0  9.5     9.5   Total Protein 6.5 - 8.1 g/dL 7.0     Total Bilirubin 0.3 - 1.2 mg/dL 0.4     Alkaline Phos 38 - 126 U/L 54  72     78   AST 15 - 41 U/L '17  25     27   '$ ALT 0 - 44 U/L '13  15     16      '$ This result is from an external source.     Lab Results  Component Value Date   CEA1 2.0 01/23/2022   CEA 1.6 06/25/2019   /  CEA  Date Value Ref Range Status  01/23/2022 2.0 0.0 - 4.7 ng/mL Final    Comment:    (NOTE)                             Nonsmokers          <3.9                             Smokers             <5.6 [employer] Electrochemiluminescence Immunoassay (ECLIA) Values obtained with different assay methods or kits cannot be used interchangeably.  Results cannot be interpreted as absolute evidence of the presence or  absence of malignant disease. Performed At: [facility] [place], [place] [id number] Rush Farmer MD [id number]   06/25/2019 1.6  Final     STUDIES:  No results found.   Allergies:  Allergies  Allergen Reactions   Other Anaphylaxis    Hornet sting    Current Medications: Current Outpatient Medications  Medication Sig Dispense Refill   levothyroxine (SYNTHROID) 112 MCG tablet Take 112 mcg by mouth daily.     clopidogrel (PLAVIX) 75 MG tablet Take 0.5 tablets (37.5 mg total) by mouth daily. 45 tablet 2   EPINEPHrine 0.3 mg/0.3 mL IJ SOAJ injection Inject into the muscle.     No current facility-administered medications for this visit.     ASSESSMENT & PLAN:   Assessment:   1. Stage IIIB colon cancer diagnosed in November 2019.  She was treated with surgery and adjuvant capecitabine.  She remains without evidence  of recurrence.  2. Osteoporosis with history of right hip fracture, left pubic fracture and multiple fractures of her foot.  The patient was started on Reclast in late August 2022, and had her second dose on August 25.  The patient started calcium and vitamin-D and will continue it at least daily. She is due for repeat bone density.  3. Hypothyroidism but her TSH was in the normal range in August 2022.  Plan: She continues to do fairly well, and is 4 years out from her diagnosis. We will plan to see her back in 4 months with a CBC, comprehensive metabolic panel, and CEA.  I do recommend repeat colonoscopy with Dr. Noberto Retort as long as she remains healthy, and EGD as well. I will refer her to him for that. She is  due for her DEXA scan and I will get that ordered. The patient understands the plans discussed today and is in agreement with them.  She knows to contact our office if she develops concerns prior to her next appointment.   I provided 20 minutes of face-to-face time during this this encounter and > 50% was spent counseling as documented under my assessment and plan.    Derwood Kaplan, MD [facility] [address] [place] [address] Dept: [phone] Dept Fax: [phone]

## 2022-01-23 NOTE — Telephone Encounter (Signed)
-----   Message from Derwood Kaplan, MD sent at 01/23/2022  9:39 AM EST ----- Regarding: DEXA Would you see if she had DEXA this year?

## 2022-01-24 LAB — CEA: CEA: 2 ng/mL (ref 0.0–4.7)

## 2022-01-26 NOTE — Progress Notes (Deleted)
[facility] [place] Phone: [phone] Subjective:    I'm seeing this patient by the request  of:  Rachel Anes, MD  CC:   [id number]  11/29/2021 Patient given injection today.  We will get x-rays with patient undergoing the malignant lung cancer treatment at the moment.  We discussed that there is a possibility for a stress reaction.  Patient though did have some atrophy of this musculature in this area from patient's previous fracture and IM rod previously.  Do not see any true masses on the x-ray but over waiting read Discussed with patient about different exercises that could be beneficial as well.  Just trying to help patient's quality of life at the moment.  Follow-up again in 6 to 8 weeks.      Update 01/31/2022 [name] is a 87 y.o. female coming in with complaint of R hip pain. Patient states   -     Past Medical History:  Diagnosis Date   Hypothyroidism    Osteoporosis with fracture 05/03/2019   S/P right hip fracture 05/02/2019   Past Surgical History:  Procedure Laterality Date   ABDOMINAL HYSTERECTOMY     w/ bilateral salpingo oophorectomy   APPENDECTOMY     CHOLECYSTECTOMY     COLON SURGERY  2020   right hemicolectomy   REPLACEMENT TOTAL KNEE BILATERAL     TONSILLECTOMY     Social History   Socioeconomic History   Marital status: Widowed    Spouse name: Not on file   Number of children: 0   Years of education: Not on file   Highest education level: Not on file  Occupational History   Not on file  Tobacco Use   Smoking status: Never   Smokeless tobacco: Never  Substance and Sexual Activity   Alcohol use: Never    Alcohol/week: 0.0 standard drinks of alcohol   Drug use: Never   Sexual activity: Not Currently  Other Topics Concern   Not on file  Social History Narrative   Not on file   Social Determinants of Health   Financial Resource Strain: Not on file   Food Insecurity: Not on file  Transportation Needs: Not on file  Physical Activity: Not on file  Stress: Not on file  Social Connections: Not on file   Allergies  Allergen Reactions   Other Anaphylaxis    Hornet sting   No family history on file.  Current Outpatient Medications (Endocrine & Metabolic):    levothyroxine (SYNTHROID) 112 MCG tablet, Take 112 mcg by mouth daily.  Current Outpatient Medications (Cardiovascular):    EPINEPHrine 0.3 mg/0.3 mL IJ SOAJ injection, Inject into the muscle.    Current Outpatient Medications (Hematological):    clopidogrel (PLAVIX) 75 MG tablet, Take 0.5 tablets (37.5 mg total) by mouth daily.    Reviewed prior external information including notes and imaging from  primary care provider As well as notes that were available from care everywhere and other healthcare systems.  Past medical history, social, surgical and family history all reviewed in electronic medical record.  No pertanent information unless stated regarding to the chief complaint.   Review of Systems:  No headache, visual changes, nausea, vomiting, diarrhea, constipation, dizziness, abdominal pain, skin rash, fevers, chills, night sweats, weight loss, swollen lymph nodes, body aches, joint swelling, chest pain, shortness of breath, mood changes. POSITIVE muscle aches  Objective  There were no vitals taken for this visit.  General: No apparent distress alert and oriented x3 mood and affect normal, dressed appropriately.  HEENT: Pupils equal, extraocular movements intact  Respiratory: Patient's speak in full sentences and does not appear short of breath  Cardiovascular: No lower extremity edema, non tender, no erythema      Impression and Recommendations:

## 2022-01-31 ENCOUNTER — Ambulatory Visit: Payer: Medicare Other | Admitting: Family Medicine

## 2022-02-07 ENCOUNTER — Encounter: Payer: Self-pay | Admitting: Oncology

## 2022-02-13 ENCOUNTER — Encounter: Payer: Self-pay | Admitting: Oncology

## 2022-02-14 ENCOUNTER — Encounter: Payer: Self-pay | Admitting: Oncology

## 2022-03-08 ENCOUNTER — Telehealth: Payer: Self-pay

## 2022-03-08 NOTE — Telephone Encounter (Signed)
-----   Message from Marvia Pickles, PA-C sent at 03/08/2022 11:18 AM EST ----- Regarding: RE: COVID vaccine Wait until symptoms resolve. If worsen, see PCP. Thanks ----- Message ----- From: Belva Chimes, LPN Sent: 14/60/4799  11:16 AM EST To: Marvia Pickles, PA-C Subject: COVID vaccine                                  Patient called wanting to know if she can get her COVID shot with a runny nose and cough. If not, how long should she wait?

## 2022-03-08 NOTE — Telephone Encounter (Signed)
Called patient and notified her of the message from [facility] PA-C. Notified her that she should wait until her symptoms go away before getting the COVID vaccine. If the symptoms get worse to call PCP. Patient voiced her understanding.

## 2022-03-16 ENCOUNTER — Ambulatory Visit: Payer: Medicare Other | Admitting: Family Medicine

## 2022-03-22 ENCOUNTER — Ambulatory Visit: Payer: Medicare Other | Admitting: Family Medicine

## 2022-03-23 NOTE — Progress Notes (Deleted)
[place] Phone: [phone] Subjective:    I'm seeing this patient by the request  of:  [name], MD  CC:   [id number]  11/29/2021 Patient given injection today.  We will get x-rays with patient undergoing the malignant lung cancer treatment at the moment.  We discussed that there is a possibility for a stress reaction.  Patient though did have some atrophy of this musculature in this area from patient's previous fracture and IM rod previously.  Do not see any true masses on the x-ray but over waiting read Discussed with patient about different exercises that could be beneficial as well.  Just trying to help patient's quality of life at the moment.  Follow-up again in 6 to 8 weeks.      Update 03/29/2022 [name] is a 88 y.o. female coming in with complaint of R hip pain. Patient states        Past Medical History:  Diagnosis Date   Hypothyroidism    Osteoporosis with fracture 05/03/2019   S/P right hip fracture 05/02/2019   Past Surgical History:  Procedure Laterality Date   ABDOMINAL HYSTERECTOMY     w/ bilateral salpingo oophorectomy   APPENDECTOMY     CHOLECYSTECTOMY     COLON SURGERY  2020   right hemicolectomy   REPLACEMENT TOTAL KNEE BILATERAL     TONSILLECTOMY     Social History   Socioeconomic History   Marital status: Widowed    Spouse name: Not on file   Number of children: 0   Years of education: Not on file   Highest education level: Not on file  Occupational History   Not on file  Tobacco Use   Smoking status: Never   Smokeless tobacco: Never  Substance and Sexual Activity   Alcohol use: Never    Alcohol/week: 0.0 standard drinks of alcohol   Drug use: Never   Sexual activity: Not Currently  Other Topics Concern   Not on file  Social History Narrative   Not on file   Social Determinants of Health   Financial Resource Strain: Not on file   Food Insecurity: Not on file  Transportation Needs: Not on file  Physical Activity: Not on file  Stress: Not on file  Social Connections: Not on file   Allergies  Allergen Reactions   Other Anaphylaxis    Hornet sting   No family history on file.  Current Outpatient Medications (Endocrine & Metabolic):    levothyroxine (SYNTHROID) 112 MCG tablet, Take 112 mcg by mouth daily.  Current Outpatient Medications (Cardiovascular):    EPINEPHrine 0.3 mg/0.3 mL IJ SOAJ injection, Inject into the muscle.    Current Outpatient Medications (Hematological):    clopidogrel (PLAVIX) 75 MG tablet, Take 0.5 tablets (37.5 mg total) by mouth daily.    Reviewed prior external information including notes and imaging from  primary care provider As well as notes that were available from care everywhere and other healthcare systems.  Past medical history, social, surgical and family history all reviewed in electronic medical record.  No pertanent information unless stated regarding to the chief complaint.   Review of Systems:  No headache, visual changes, nausea, vomiting, diarrhea, constipation, dizziness, abdominal pain, skin rash, fevers, chills, night sweats, weight loss, swollen lymph nodes, body aches, joint swelling, chest pain, shortness of breath, mood changes. POSITIVE muscle aches  Objective  There were no vitals taken for this visit.  General: No apparent distress alert and oriented x3 mood and affect normal, dressed appropriately.  HEENT: Pupils equal, extraocular movements intact  Respiratory: Patient's speak in full sentences and does not appear short of breath  Cardiovascular: No lower extremity edema, non tender, no erythema      Impression and Recommendations:

## 2022-03-29 ENCOUNTER — Ambulatory Visit: Payer: Medicare Other | Admitting: Family Medicine

## 2022-04-10 DIAGNOSIS — Z85038 Personal history of other malignant neoplasm of large intestine: Secondary | ICD-10-CM | POA: Diagnosis not present

## 2022-04-10 DIAGNOSIS — R194 Change in bowel habit: Secondary | ICD-10-CM | POA: Diagnosis not present

## 2022-04-17 NOTE — Progress Notes (Unsigned)
[name] Phone: [phone] Subjective:   [name], am serving as a scribe for Dr. Hulan Saas.  I'm seeing this patient by the request  of:  [name], MD (Inactive)  CC: Right hip pain  [id number]  11/29/2021 Patient given injection today.  We will get x-rays with patient undergoing the malignant lung cancer treatment at the moment.  We discussed that there is a possibility for a stress reaction.  Patient though did have some atrophy of this musculature in this area from patient's previous fracture and IM rod previously.  Do not see any true masses on the x-ray but over waiting read Discussed with patient about different exercises that could be beneficial as well.  Just trying to help patient's quality of life at the moment.  Follow-up again in 6 to 8 weeks.      Update 04/18/2022 [name] is a 88 y.o. female coming in with complaint of R hip pain. Patient states that injection and exercise. Pain is over lateral aspect but radiates into glute and lower back.       Past Medical History:  Diagnosis Date   Hypothyroidism    Osteoporosis with fracture 05/03/2019   S/P right hip fracture 05/02/2019   Past Surgical History:  Procedure Laterality Date   ABDOMINAL HYSTERECTOMY     w/ bilateral salpingo oophorectomy   APPENDECTOMY     CHOLECYSTECTOMY     COLON SURGERY  2020   right hemicolectomy   REPLACEMENT TOTAL KNEE BILATERAL     TONSILLECTOMY     Social History   Socioeconomic History   Marital status: Widowed    Spouse name: Not on file   Number of children: 0   Years of education: Not on file   Highest education level: Not on file  Occupational History   Not on file  Tobacco Use   Smoking status: Never   Smokeless tobacco: Never  Substance and Sexual Activity   Alcohol use: Never    Alcohol/week: 0.0 standard drinks of alcohol   Drug use: Never   Sexual  activity: Not Currently  Other Topics Concern   Not on file  Social History Narrative   Not on file   Social Determinants of Health   Financial Resource Strain: Not on file  Food Insecurity: Not on file  Transportation Needs: Not on file  Physical Activity: Not on file  Stress: Not on file  Social Connections: Not on file   Allergies  Allergen Reactions   Other Anaphylaxis    Hornet sting   Todd family history on file.  Current Outpatient Medications (Endocrine & Metabolic):    levothyroxine (SYNTHROID) 112 MCG tablet, Take 112 mcg by mouth daily.  Current Outpatient Medications (Cardiovascular):    EPINEPHrine 0.3 mg/0.3 mL IJ SOAJ injection, Inject into the muscle.    Current Outpatient Medications (Hematological):    clopidogrel (PLAVIX) 75 MG tablet, Take 0.5 tablets (37.5 mg total) by mouth daily.    Reviewed prior external information including notes and imaging from  primary care provider As well as notes that were available from care everywhere and other healthcare systems.  Past medical history, social, surgical and family history all reviewed in electronic medical record.  Todd pertanent information unless stated regarding to the chief complaint.   Review of Systems:  Todd headache, visual changes, nausea, vomiting, diarrhea, constipation, dizziness, abdominal pain, skin rash, fevers, chills, night sweats, weight  loss, swollen lymph nodes, body aches, joint swelling, chest pain, shortness of breath, mood changes. POSITIVE muscle aches  Objective  Blood pressure (!) 142/74, pulse (!) 58, height 5' 5.5" (1.664 m), weight 126 lb (57.2 kg), SpO2 99 %.   General: Todd apparent distress alert and oriented x3 mood and affect normal, dressed appropriately.  HEENT: Pupils equal, extraocular movements intact  Respiratory: Patient's speak in full sentences and does not appear short of breath  Cardiovascular: Todd lower extremity edema, non tender, Todd erythema  Right hip exam  shows the patient does have tenderness to palpation noted over the greater trochanteric area.  Patient does have the postsurgical changes in the area as well.  Positive FABER test noted.   After verbal consent patient was prepped with alcohol swab and with a 21-gauge 2 inch needle injected into the right greater trochanteric area with 2 cc of 0.5% Marcaine and 1 cc of Kenalog 40 mg/mL.  Todd blood loss.  Band-Aid placed.  Postinjection instructions given    Impression and Recommendations:    The above documentation has been reviewed and is accurate and complete Lyndal Pulley, DO

## 2022-04-18 ENCOUNTER — Ambulatory Visit: Payer: Medicare Other | Admitting: Family Medicine

## 2022-04-18 VITALS — BP 142/74 | HR 58 | Ht 65.5 in | Wt 126.0 lb

## 2022-04-18 DIAGNOSIS — M25551 Pain in right hip: Secondary | ICD-10-CM

## 2022-04-18 NOTE — Assessment & Plan Note (Signed)
The patient has had the malignant cancer.  Because patient is having some of the other treatments at the moment given that is GT injection given that did help out significantly.  Hopefully that this will make some improvement.  Does have the postsurgical changes related to continue to monitor.  No pain in the groin so do not feel that further evaluation with x-rays are necessary.  Patient has many different comorbidities that she is still needing to work with and is to undergo a colonoscopy to make sure colon cancer is not reoccurring.  Follow-up with me again in 3 months

## 2022-04-18 NOTE — Patient Instructions (Signed)
Injected hip See me again in 3 months but if good can push it out another 2-3 months

## 2022-05-22 NOTE — Progress Notes (Incomplete)
[facility]  [address] [place],  [place]  [address] [phone]  Clinic Day: 01/23/22  Referring physician: [name],*   CHIEF COMPLAINT:  CC: Stage IIIB colon cancer  Current Treatment:  Surveillance  HISTORY OF PRESENT ILLNESS:  [name] is a 88 y.o. female with stage IIIB (T3 N1b M0) colon cancer diagnosed in November 2019.  She had never had a colonoscopy before, but had unintentional weight loss of 35 lb over the last year with decreased appetite.  A stool Hemoccult was positive.  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  The right colon revealed a sessile serrated polyp with an area of low-grade dysplasia as well as a tubular adenoma and small fragments of adenocarcinoma.  The left colon also revealed tubular adenomas and a benign hyperplastic polyp.  Small bowel biopsies were benign.  CT scan showed a few tiny subcentimeter lesions in the left hepatic lobe of low-attenuation which appear most consistent with cysts.  There were multiple multiple small gallstones and a small benign appearing left renal cyst.  However, she had an annular soft tissue mass of the ascending colon with several subcentimeter pericolonic nodes up to 9 mm in diameter which were suspicious for metastatic disease.  She was referred to Dr. Noberto Retort and underwent laparoscopic right hemicolectomy and primary ileocolostomy, as well as a cholecystectomy, in December 2019.  The final pathology revealed 2 separate tumors, 1 measuring 9.5 cm and the other 0.4 cm.  This was a moderately differentiated invasive colorectal adenocarcinoma with mucinous features.  It extended into the pericolonic connective tissue and the smaller tumor extended into the muscularis propria.  There were some signet ring features and 3/35 nodes were positive for metastasis.  There was lymphovascular invasion.  Margins were clear.  The  gallbladder revealed cholelithiasis and chronic cholecystitis.  Postop CEA was 8.3, but it was normal by January 2020.  I recommended adjuvant chemotherapy.  She wanted to take oral chemotherapy, so was placed on capecitabine 1000 mg twice daily in February 2020.  Due to toxicities, we eventually lowered her dose to 1000 mg in the morning and 500 mg in the afternoon.  The iron pills made her diarrhea worse, so that was discontinued.  She completed 6 cycles of capecitabine in June 2020.  She underwent colonoscopy to ileocolic anastomosis in December 2020 with Dr. Noberto Retort. which was negative.  She fell and fractured her right hip at the end of January 2021, and underwent surgery with Dr. Samule Dry on February 1st, 2021.  We saw her for routine follow-up in April and recommended a bone density scan.  This revealed osteoporosis with a T-score of -2 5 in the left femur, as well as a T-score -1.8 the spine.  We discussed treatment options with the patient via telephone and she opted for yearly IV Reclast, which we had planned to give her at her routine follow-up in July.  She was instructed to start calcium and vitamin-D as well.  She presented to emergency department due to another fall and CT imaging there was a minimally displaced fracture involving the left pubic body, extending into the superior ramus, with an additional fracture of the left pubic root as well, extending into the superior pubic ramus.  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  INTERVAL HISTORY:  [name] is here for routine follow up stage IIIB colon cancer     She  did have a fracture of the pubic ramus in the past and continues to complain of pain of the back and hip, especially when moving around. She is on Reclast once yearly for her osteoporosis. Her last DEXA was 07/09/19 so she is due this year, and I will get that scheduled.   She has a new PCP in [place] and she put her back on iron. Her hemoglobin is 13.9 with an MCV of 93 and her  CMP and CEA are all normal. She is due for colonoscopy.   Her  appetite is good, and her weight is stable since her last visit.  She denies fever, chills or other signs of infection.  She denies nausea, vomiting, bowel issues, or abdominal pain.  She denies sore throat, cough, dyspnea, or chest pain.  REVIEW OF SYSTEMS:  Review of Systems  Constitutional: Negative.  Negative for appetite change, chills, diaphoresis, fatigue, fever and unexpected weight change.       Cold intolerance  HENT:  Negative.  Negative for hearing loss, lump/mass, mouth sores, nosebleeds, sore throat, tinnitus, trouble swallowing and voice change.   Eyes: Negative.  Negative for eye problems and icterus.  Respiratory: Negative.  Negative for chest tightness, cough, hemoptysis, shortness of breath and wheezing.   Cardiovascular: Negative.  Negative for chest pain, leg swelling and palpitations.  Gastrointestinal: Negative.  Negative for abdominal distention, abdominal pain, blood in stool, constipation, diarrhea, nausea, rectal pain and vomiting.       Frequent bowel movements, chronic since surgery  Endocrine: Negative.   Genitourinary: Negative.  Negative for bladder incontinence, difficulty urinating, dyspareunia, dysuria, frequency, hematuria, menstrual problem, nocturia, pelvic pain, vaginal bleeding and vaginal discharge.   Musculoskeletal: Negative.  Negative for arthralgias, back pain, flank pain, gait problem, myalgias, neck pain and neck stiffness.  Skin: Negative.  Negative for itching, rash and wound.  Neurological: Negative.  Negative for dizziness, extremity weakness, gait problem, headaches, light-headedness, numbness, seizures and speech difficulty.  Hematological: Negative.  Negative for adenopathy. Does not bruise/bleed easily.  Psychiatric/Behavioral: Negative.  Negative for confusion, decreased concentration, depression, sleep disturbance and suicidal ideas. The patient is not nervous/anxious.       VITALS:  There were no vitals taken for this visit.  Wt Readings from Last 3 Encounters:  04/18/22 126 lb (57.2 kg)  01/23/22 128 lb 8 oz (58.3 kg)  11/14/21 127 lb 1.9 oz (57.7 kg)    There is no height or weight on file to calculate BMI.  Performance status (ECOG): 0 - Asymptomatic  PHYSICAL EXAM:  Physical Exam Vitals and nursing note reviewed.  Constitutional:      General: She is not in acute distress.    Appearance: Normal appearance. She is normal weight. She is not ill-appearing, toxic-appearing or diaphoretic.  HENT:     Head: Normocephalic and atraumatic.     Right Ear: Tympanic membrane, ear canal and external ear normal. There is no impacted cerumen.     Left Ear: Tympanic membrane, ear canal and external ear normal. There is no impacted cerumen.     Nose: Nose normal. No congestion or rhinorrhea.     Mouth/Throat:     Mouth: Mucous membranes are moist.     Pharynx: Oropharynx is clear. No oropharyngeal exudate or posterior oropharyngeal erythema.  Eyes:     General: No scleral icterus.       Right eye: No discharge.        Left eye: No discharge.     Extraocular  Movements: Extraocular movements intact.     Conjunctiva/sclera: Conjunctivae normal.     Pupils: Pupils are equal, round, and reactive to light.  Neck:     Vascular: No carotid bruit.  Cardiovascular:     Rate and Rhythm: Normal rate and regular rhythm.     Pulses: Normal pulses.     Heart sounds: Normal heart sounds. No murmur heard.    No friction rub. No gallop.  Pulmonary:     Effort: Pulmonary effort is normal. No respiratory distress.     Breath sounds: Normal breath sounds. No stridor. No wheezing, rhonchi or rales.  Chest:     Chest wall: No tenderness.  Abdominal:     General: Bowel sounds are normal. There is no distension.     Palpations: Abdomen is soft. There is no hepatomegaly, splenomegaly or mass.     Tenderness: There is no abdominal tenderness. There is no right CVA  tenderness, left CVA tenderness, guarding or rebound.     Hernia: No hernia is present.  Musculoskeletal:        General: No swelling or deformity. Normal range of motion.     Cervical back: Normal range of motion and neck supple. No rigidity or tenderness.     Right lower leg: No edema.     Left lower leg: No edema.  Lymphadenopathy:     Cervical: No cervical adenopathy.     Right cervical: No superficial, deep or posterior cervical adenopathy.    Left cervical: No superficial, deep or posterior cervical adenopathy.     Upper Body:     Right upper body: No supraclavicular, axillary or pectoral adenopathy.     Left upper body: No supraclavicular, axillary or pectoral adenopathy.  Skin:    General: Skin is warm and dry.     Coloration: Skin is not jaundiced or pale.     Findings: No bruising, erythema, lesion or rash.  Neurological:     General: No focal deficit present.     Mental Status: She is alert and oriented to person, place, and time. Mental status is at baseline.     Cranial Nerves: No cranial nerve deficit.     Sensory: No sensory deficit.     Motor: No weakness.     Coordination: Coordination normal.     Gait: Gait normal.     Deep Tendon Reflexes: Reflexes normal.  Psychiatric:        Mood and Affect: Mood normal.        Behavior: Behavior normal.        Thought Content: Thought content normal.        Judgment: Judgment normal.    LABS:      Latest Ref Rng & Units 01/23/2022    1:03 PM 09/22/2021   12:00 AM 05/11/2021   12:00 AM  CBC  WBC 4.0 - 10.5 K/uL 7.5  7.5     7.7   Hemoglobin 12.0 - 15.0 g/dL 13.9  13.5     13.8   Hematocrit 36.0 - 46.0 % 43.0  40     41   Platelets 150 - 400 K/uL 279  289     283      This result is from an external source.       Latest Ref Rng & Units 01/23/2022    1:03 PM 09/22/2021   12:00 AM 05/11/2021   12:00 AM  CMP  Glucose 70 - 99 mg/dL 96     BUN  8 - 23 mg/dL '10  6     13   '$ Creatinine 0.44 - 1.00 mg/dL 0.79  0.7     0.6    Sodium 135 - 145 mmol/L 139  140     141   Potassium 3.5 - 5.1 mmol/L 4.0  3.8     3.8   Chloride 98 - 111 mmol/L 106  108     109   CO2 22 - 32 mmol/L '25  26     26   '$ Calcium 8.9 - 10.3 mg/dL 9.0  9.5     9.5   Total Protein 6.5 - 8.1 g/dL 7.0     Total Bilirubin 0.3 - 1.2 mg/dL 0.4     Alkaline Phos 38 - 126 U/L 54  72     78   AST 15 - 41 U/L '17  25     27   '$ ALT 0 - 44 U/L '13  15     16      '$ This result is from an external source.      Lab Results  Component Value Date   CEA1 2.0 01/23/2022   CEA 1.6 06/25/2019   /  CEA  Date Value Ref Range Status  01/23/2022 2.0 0.0 - 4.7 ng/mL Final    Comment:    (NOTE)                             Nonsmokers          <3.9                             Smokers             <5.6 [employer] Electrochemiluminescence Immunoassay (ECLIA) Values obtained with different assay methods or kits cannot be used interchangeably.  Results cannot be interpreted as absolute evidence of the presence or absence of malignant disease. Performed At: [facility] [place], [place] [id number] Rush Farmer MD [id number]   06/25/2019 1.6  Final   STUDIES:  No results found.    Allergies:  Allergies  Allergen Reactions   Other Anaphylaxis    Hornet sting    Current Medications: Current Outpatient Medications  Medication Sig Dispense Refill   clopidogrel (PLAVIX) 75 MG tablet Take 0.5 tablets (37.5 mg total) by mouth daily. 45 tablet 2   EPINEPHrine 0.3 mg/0.3 mL IJ SOAJ injection Inject into the muscle.     levothyroxine (SYNTHROID) 112 MCG tablet Take 112 mcg by mouth daily.     No current facility-administered medications for this visit.     ASSESSMENT & PLAN:   Assessment:   1. Stage IIIB colon cancer diagnosed in November 2019.  She was treated with surgery and adjuvant capecitabine.  She remains without evidence of recurrence.  2. Osteoporosis with history of right hip fracture, left pubic fracture  and multiple fractures of her foot.  The patient was started on Reclast in late August 2022, and had her second dose on August 25.  The patient started calcium and vitamin-D and will continue it at least daily. She is due for repeat bone density.  3. Hypothyroidism but her TSH was in the normal range in August 2022.  Plan: She continues to do fairly well, and is 4 years out from her diagnosis. We will plan to see her back in 4 months with a  CBC, comprehensive metabolic panel, and CEA.  I do recommend repeat colonoscopy with Dr. Noberto Retort as long as she remains healthy, and EGD as well. I will refer her to him for that. She is  due for her DEXA scan and I will get that ordered. The patient understands the plans discussed today and is in agreement with them.  She knows to contact our office if she develops concerns prior to her next appointment.  I provided 20 minutes of face-to-face time during this this encounter and > 50% was spent counseling as documented under my assessment and plan.    Derwood Kaplan, MD [place] [address] [place] [address] Dept: [phone] Dept Fax: [phone]    [name],acting as a scribe for Derwood Kaplan, MD.,have documented all relevant documentation on the behalf of Derwood Kaplan, MD,as directed by  Derwood Kaplan, MD while in the presence of Derwood Kaplan, MD.

## 2022-05-24 ENCOUNTER — Other Ambulatory Visit: Payer: Medicare Other

## 2022-05-24 ENCOUNTER — Ambulatory Visit: Payer: Medicare Other | Admitting: Oncology

## 2022-06-14 ENCOUNTER — Encounter: Payer: Self-pay | Admitting: Oncology

## 2022-06-14 ENCOUNTER — Inpatient Hospital Stay: Payer: Medicare Other | Attending: Oncology

## 2022-06-14 ENCOUNTER — Other Ambulatory Visit: Payer: Self-pay | Admitting: Oncology

## 2022-06-14 ENCOUNTER — Inpatient Hospital Stay (INDEPENDENT_AMBULATORY_CARE_PROVIDER_SITE_OTHER): Payer: Medicare Other | Admitting: Oncology

## 2022-06-14 VITALS — BP 120/82 | HR 81 | Temp 97.9°F | Resp 18 | Ht 65.5 in | Wt 121.7 lb

## 2022-06-14 DIAGNOSIS — Z85038 Personal history of other malignant neoplasm of large intestine: Secondary | ICD-10-CM | POA: Diagnosis present

## 2022-06-14 DIAGNOSIS — C182 Malignant neoplasm of ascending colon: Secondary | ICD-10-CM

## 2022-06-14 DIAGNOSIS — M810 Age-related osteoporosis without current pathological fracture: Secondary | ICD-10-CM

## 2022-06-14 LAB — CBC WITH DIFFERENTIAL (CANCER CENTER ONLY)
Abs Immature Granulocytes: 0.09 10*3/uL — ABNORMAL HIGH (ref 0.00–0.07)
Basophils Absolute: 0 10*3/uL (ref 0.0–0.1)
Basophils Relative: 1 %
Eosinophils Absolute: 0.2 10*3/uL (ref 0.0–0.5)
Eosinophils Relative: 2 %
HCT: 43 % (ref 36.0–46.0)
Hemoglobin: 14 g/dL (ref 12.0–15.0)
Immature Granulocytes: 1 %
Lymphocytes Relative: 18 %
Lymphs Abs: 1.2 10*3/uL (ref 0.7–4.0)
MCH: 29.9 pg (ref 26.0–34.0)
MCHC: 32.6 g/dL (ref 30.0–36.0)
MCV: 91.7 fL (ref 80.0–100.0)
Monocytes Absolute: 0.6 10*3/uL (ref 0.1–1.0)
Monocytes Relative: 8 %
Neutro Abs: 4.8 10*3/uL (ref 1.7–7.7)
Neutrophils Relative %: 70 %
Platelet Count: 269 10*3/uL (ref 150–400)
RBC: 4.69 MIL/uL (ref 3.87–5.11)
RDW: 13.9 % (ref 11.5–15.5)
WBC Count: 6.9 10*3/uL (ref 4.0–10.5)
nRBC: 0 % (ref 0.0–0.2)

## 2022-06-14 LAB — CMP (CANCER CENTER ONLY)
ALT: 11 U/L (ref 0–44)
AST: 18 U/L (ref 15–41)
Albumin: 4.4 g/dL (ref 3.5–5.0)
Alkaline Phosphatase: 60 U/L (ref 38–126)
Anion gap: 9 (ref 5–15)
BUN: 8 mg/dL (ref 8–23)
CO2: 24 mmol/L (ref 22–32)
Calcium: 9.1 mg/dL (ref 8.9–10.3)
Chloride: 105 mmol/L (ref 98–111)
Creatinine: 0.8 mg/dL (ref 0.44–1.00)
GFR, Estimated: >60 mL/min (ref >60)
Glucose, Bld: 94 mg/dL (ref 70–99)
Potassium: 3.4 mmol/L — ABNORMAL LOW (ref 3.5–5.1)
Sodium: 138 mmol/L (ref 135–145)
Total Bilirubin: 0.4 mg/dL (ref 0.3–1.2)
Total Protein: 7.1 g/dL (ref 6.5–8.1)

## 2022-06-14 NOTE — Progress Notes (Addendum)
[facility]  [address] [place],  [place]  [address] [phone]  Clinic Day: 06/14/22  Referring physician: Abigail Miyamoto,*   CHIEF COMPLAINT:  CC: Stage IIIB colon cancer  Current Treatment:  Surveillance  HISTORY OF PRESENT ILLNESS:  [name] is a 88 y.o. female with stage IIIB (T3 N1b M0) colon cancer diagnosed in November 2019.  She had never had a colonoscopy before, but had unintentional weight loss of 35 lb over the last year with decreased appetite.  A stool Hemoccult was positive.  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  The right colon revealed a sessile serrated polyp with an area of low-grade dysplasia as well as a tubular adenoma and small fragments of adenocarcinoma.  The left colon also revealed tubular adenomas and a benign hyperplastic polyp.  Small bowel biopsies were benign.  CT scan showed a few tiny subcentimeter lesions in the left hepatic lobe of low-attenuation which appear most consistent with cysts.  There were multiple multiple small gallstones and a small benign appearing left renal cyst.  However, she had an annular soft tissue mass of the ascending colon with several subcentimeter pericolonic nodes up to 9 mm in diameter which were suspicious for metastatic disease.  She was referred to Dr. Georgiana Shore and underwent laparoscopic right hemicolectomy and primary ileocolostomy, as well as a cholecystectomy, in December 2019.  The final pathology revealed 2 separate tumors, 1 measuring 9.5 cm and the other 0.4 cm.  This was a moderately differentiated invasive colorectal adenocarcinoma with mucinous features.  It extended into the pericolonic connective tissue and the smaller tumor extended into the muscularis propria.  There were some signet ring features and 3/35 nodes were positive for metastasis.  There was lymphovascular invasion.  Margins were clear.  The  gallbladder revealed cholelithiasis and chronic cholecystitis.  Postop CEA was 8.3, but it was normal by January 2020.  I recommended adjuvant chemotherapy.  She wanted to take oral chemotherapy, so was placed on capecitabine 1000 mg twice daily in February 2020.  Due to toxicities, we eventually lowered her dose to 1000 mg in the morning and 500 mg in the afternoon.  The iron pills made her diarrhea worse, so that was discontinued.  She completed 6 cycles of capecitabine in June 2020.  She underwent colonoscopy to ileocolic anastomosis in December 2020 with Dr. Georgiana Shore. which was negative.  She fell and fractured her right hip at the end of January 2021, and underwent surgery with Dr. Deberah Castle on February 1st, 2021.  We saw her for routine follow-up in April and recommended a bone density scan.  This revealed osteoporosis with a T-score of -2 5 in the left femur, as well as a T-score -1.8 the spine.  We discussed treatment options with the patient via telephone and she opted for yearly IV Reclast, which we had planned to give her at her routine follow-up in July.  She was instructed to start calcium and vitamin-D as well.  She presented to emergency department due to another fall and CT imaging there was a minimally displaced fracture involving the left pubic body, extending into the superior ramus, with an additional fracture of the left pubic root as well, extending into the superior pubic ramus.  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  INTERVAL HISTORY:  [name] is here for routine follow up for stage IIIB colon cancer. Patient states that she  feels ok and complains of watery diarrhea and hip pain. She states that she doesn't take anything to solve it and I recommended that she take 1 pill of Imodium as needed. Patient fell out of bed last night and has a scratch on her head and hit her left hip. She called out for her husband last night although he has passed. Her niece is concerned about confusion but  the patient is coherent at today's appointment. She had a colonoscopy done by Dr. Georgiana Shore on 04/20/2022 that was clear. Her niece informed me that she only eats once a day and I gave her recommendation of small meals that she can eat throughout the day. Her labs today are pending and I will call her with those results. I will schedule a bone density scan as she is overdue. I will see her back in 6 months with CBC, CMP, and CEA. She denies signs of infection such as sore throat, sinus drainage, cough, or urinary symptoms.  She denies fevers or recurrent chills. She denies pain. She denies nausea, vomiting, chest pain, dyspnea or cough. Her appetite is ok and her weight has decreased 5 pounds over last 2 months . She is accompanied at today's visit with her niece.   REVIEW OF SYSTEMS:  Review of Systems  Constitutional:  Positive for fatigue. Negative for appetite change, chills, diaphoresis, fever and unexpected weight change.       Cold intolerance  HENT:  Negative.  Negative for hearing loss, lump/mass, mouth sores, nosebleeds, sore throat, tinnitus, trouble swallowing and voice change.   Eyes: Negative.  Negative for eye problems and icterus.  Respiratory: Negative.  Negative for chest tightness, cough, hemoptysis, shortness of breath and wheezing.   Cardiovascular: Negative.  Negative for chest pain, leg swelling and palpitations.  Gastrointestinal:  Positive for diarrhea. Negative for abdominal distention, abdominal pain, blood in stool, constipation, nausea, rectal pain and vomiting.       Frequent bowel movements, chronic since surgery  Endocrine: Negative.   Genitourinary: Negative.  Negative for bladder incontinence, difficulty urinating, dyspareunia, dysuria, frequency, hematuria, menstrual problem, nocturia, pelvic pain, vaginal bleeding and vaginal discharge.   Musculoskeletal: Negative.  Negative for arthralgias, back pain, flank pain, gait problem, myalgias, neck pain and neck stiffness.        Left hip pain  Skin: Negative.  Negative for itching, rash and wound.  Neurological: Negative.  Negative for dizziness, extremity weakness, gait problem, headaches, light-headedness, numbness, seizures and speech difficulty.  Hematological: Negative.  Negative for adenopathy. Does not bruise/bleed easily.  Psychiatric/Behavioral:  Positive for confusion. Negative for decreased concentration, depression, sleep disturbance and suicidal ideas. The patient is not nervous/anxious.     VITALS:  Blood pressure 120/82, pulse 81, temperature 97.9 F (36.6 C), temperature source Oral, resp. rate 18, height 5' 5.5" (1.664 m), weight 121 lb 11.2 oz (55.2 kg), SpO2 100 %.  Wt Readings from Last 3 Encounters:  06/14/22 121 lb 11.2 oz (55.2 kg)  04/18/22 126 lb (57.2 kg)  01/23/22 128 lb 8 oz (58.3 kg)    Body mass index is 19.94 kg/m.  Performance status (ECOG): 0 - Asymptomatic  PHYSICAL EXAM:  Physical Exam Vitals and nursing note reviewed. Exam conducted with a chaperone present.  Constitutional:      General: She is not in acute distress.    Appearance: Normal appearance. She is normal weight. She is not ill-appearing, toxic-appearing or diaphoretic.  HENT:     Head: Normocephalic and atraumatic.  Right Ear: Tympanic membrane, ear canal and external ear normal. There is no impacted cerumen.     Left Ear: Tympanic membrane, ear canal and external ear normal. There is no impacted cerumen.     Nose: Nose normal. No congestion or rhinorrhea.     Mouth/Throat:     Mouth: Mucous membranes are moist.     Pharynx: Oropharynx is clear. No oropharyngeal exudate or posterior oropharyngeal erythema.  Eyes:     General: No scleral icterus.       Right eye: No discharge.        Left eye: No discharge.     Extraocular Movements: Extraocular movements intact.     Conjunctiva/sclera: Conjunctivae normal.     Pupils: Pupils are equal, round, and reactive to light.  Neck:     Vascular: No  carotid bruit.  Cardiovascular:     Rate and Rhythm: Normal rate and regular rhythm.     Pulses: Normal pulses.     Heart sounds: Normal heart sounds. No murmur heard.    No friction rub. No gallop.  Pulmonary:     Effort: Pulmonary effort is normal. No respiratory distress.     Breath sounds: Normal breath sounds. No stridor. No wheezing, rhonchi or rales.  Chest:     Chest wall: No tenderness.  Abdominal:     General: Bowel sounds are normal. There is no distension.     Palpations: Abdomen is soft. There is no hepatomegaly, splenomegaly or mass.     Tenderness: There is no abdominal tenderness. There is no right CVA tenderness, left CVA tenderness, guarding or rebound.     Hernia: No hernia is present.     Comments: Large laceration in the right posterior iliac area.    Musculoskeletal:        General: No swelling, tenderness, deformity or signs of injury. Normal range of motion.     Cervical back: Normal range of motion and neck supple. No rigidity or tenderness.     Right lower leg: No edema.     Left lower leg: No edema.  Lymphadenopathy:     Cervical: No cervical adenopathy.  Skin:    General: Skin is warm and dry.     Coloration: Skin is not jaundiced or pale.     Findings: No bruising, erythema, lesion or rash.  Neurological:     General: No focal deficit present.     Mental Status: She is alert and oriented to person, place, and time. Mental status is at baseline.     Cranial Nerves: No cranial nerve deficit.     Sensory: No sensory deficit.     Motor: No weakness.     Coordination: Coordination normal.     Gait: Gait normal.     Deep Tendon Reflexes: Reflexes normal.  Psychiatric:        Mood and Affect: Mood normal.        Behavior: Behavior normal.        Thought Content: Thought content normal.        Judgment: Judgment normal.    LABS:      Latest Ref Rng & Units 06/14/2022    3:01 PM 01/23/2022    1:03 PM 09/22/2021   12:00 AM  CBC  WBC 4.0 - 10.5 K/uL  6.9  7.5  7.5      Hemoglobin 12.0 - 15.0 g/dL 26.9  48.5  46.2      Hematocrit 36.0 - 46.0 % 43.0  43.0  40      Platelets 150 - 400 K/uL 269  279  289         This result is from an external source.      Latest Ref Rng & Units 06/14/2022    3:01 PM 01/23/2022    1:03 PM 09/22/2021   12:00 AM  CMP  Glucose 70 - 99 mg/dL 94  96    BUN 8 - 23 mg/dL 8  10  6       Creatinine 0.44 - 1.00 mg/dL 4.090.80  8.110.79  0.7      Sodium 135 - 145 mmol/L 138  139  140      Potassium 3.5 - 5.1 mmol/L 3.4  4.0  3.8      Chloride 98 - 111 mmol/L 105  106  108      CO2 22 - 32 mmol/L 24  25  26       Calcium 8.9 - 10.3 mg/dL 9.1  9.0  9.5      Total Protein 6.5 - 8.1 g/dL 7.1  7.0    Total Bilirubin 0.3 - 1.2 mg/dL 0.4  0.4    Alkaline Phos 38 - 126 U/L 60  54  72      AST 15 - 41 U/L 18  17  25       ALT 0 - 44 U/L 11  13  15          This result is from an external source.   Lab Results  Component Value Date   CEA1 1.8 06/14/2022   CEA 1.6 06/25/2019   /  CEA  Date Value Ref Range Status  06/14/2022 1.8 0.0 - 4.7 ng/mL Final    Comment:    (NOTE)                             Nonsmokers          <3.9                             Smokers             <5.6 [employer] Electrochemiluminescence Immunoassay (ECLIA) Values obtained with different assay methods or kits cannot be used interchangeably.  Results cannot be interpreted as absolute evidence of the presence or absence of malignant disease. Performed At: [facility] [address] [place], [place] [id number] Jolene SchimkeNagendra Sanjai MD OZ:[id number]:[phone]   06/25/2019 1.6  Final     STUDIES:  No results found.    Allergies:  Allergies  Allergen Reactions   Other Anaphylaxis    Hornet sting    Current Medications: Current Outpatient Medications  Medication Sig Dispense Refill   clopidogrel (PLAVIX) 75 MG tablet Take 0.5 tablets (37.5 mg total) by mouth daily. 45 tablet 2   EPINEPHrine 0.3 mg/0.3 mL IJ SOAJ injection Inject into  the muscle.     levothyroxine (SYNTHROID) 112 MCG tablet Take 112 mcg by mouth daily.     No current facility-administered medications for this visit.     ASSESSMENT & PLAN:  Assessment:   1. Stage IIIB colon cancer diagnosed in November 2019.  She was treated with surgery and adjuvant capecitabine.  She remains without evidence of recurrence.  2. Osteoporosis with history of right hip fracture, left pubic fracture and multiple fractures of her foot.  The patient was started on Reclast in  late August 2022, and had her second dose on August 25.  The patient started calcium and vitamin-D and will continue it at least daily. She is due for repeat bone density.  3. Hypothyroidism but her TSH was in the normal range in August 2022.  Plan: She had a colonoscopy done by Dr. Georgiana Shore on 04/20/2022 that revealed to be clear. Her labs today are pending and I will call her with those results. I will schedule a bone density scan as she is overdue. I will see her back in 6 months with CBC, CMP, and CEA. The patient understands the plans discussed today and is in agreement with them.  She knows to contact our office if she develops concerns prior to her next appointment.  I provided 20 minutes of face-to-face time during this this encounter and > 50% was spent counseling as documented under my assessment and plan.    Dellia Beckwith, MD [facility] [address] [place] [address] Dept: [phone] Dept Fax: [phone]    Rulon Sera Lassiter,acting as a scribe for Dellia Beckwith, MD.,have documented all relevant documentation on the behalf of Dellia Beckwith, MD,as directed by  Dellia Beckwith, MD while in the presence of Dellia Beckwith, MD.

## 2022-06-14 NOTE — Progress Notes (Signed)
1 

## 2022-06-15 LAB — CEA: CEA: 1.8 ng/mL (ref 0.0–4.7)

## 2022-06-26 ENCOUNTER — Telehealth: Payer: Self-pay

## 2022-06-26 NOTE — Telephone Encounter (Signed)
Pt states, "Is it ok to take the COVID injection? Do I start the medication that she prescribed for me? Dr Gilman Buttner will know what I am talking about".

## 2022-06-27 ENCOUNTER — Ambulatory Visit: Payer: Medicare Other | Admitting: Dietician

## 2022-06-27 ENCOUNTER — Encounter: Payer: Self-pay | Admitting: Oncology

## 2022-06-27 NOTE — Progress Notes (Signed)
Nutrition Assessment   Reason for Assessment: MST screen for weight loss.    ASSESSMENT: Patient is 88 year old female who was in for routine follow up for stage IIIB colon cancer.   She is followed by Dr. Gilman Buttner and last appointment she came with niece who was concerned with recent confusion.  Called patient at home telephone number she reports she didn't start taking the Imodium as requested, she states she lives alone and does all her own shopping.  She said she didn't start the imodium "I just found out about it today."  She states she "eat's like I should, breakfast lunch and dinner."  Tried to have her recall but only kept saying eat regular foods.    Labs:  06/14/22 K 3.4  Anthropometrics: lost 4.3# (3.4%) past 2 months which is not significant but she has been losing gradually over past year CBW is underweight  Height: 64.5" Weight:  06/14/22  121.7 04/18/22  162#  BMI: 19.94  INTERVENTION:  Relayed that nutrition services are wrap around service provided at no charge and encouraged continued communication if experiencing continued weight loss or any nutritional impact symptoms (NIS).  Encouraged small frequent feeds and trying to eat 6 small meals.  Reviewed strategies for diarrhea.  Encouraged fluids and high potassium choices and liquids.   Mailed Nutrition Tip sheet  for Diarrhea and high potassium foods with contact information and coupons for Ensure Complete to preferred PO box address.   MONITORING, EVALUATION, GOAL: weight, PO intake, Nutrition Impact Symptoms, labs Goal is weight maintenance  Next Visit: PRN at patient or provider request  Gennaro Africa, RDN, LDN Registered Dietitian, [facility] Part Time Remote (Usual office hours: Tuesday-Thursday) Cell: [phone]

## 2022-07-02 ENCOUNTER — Encounter: Payer: Self-pay | Admitting: Oncology

## 2022-07-03 ENCOUNTER — Telehealth: Payer: Self-pay

## 2022-07-03 NOTE — Telephone Encounter (Signed)
-----   Message from Dellia Beckwith, MD sent at 07/02/2022  2:58 PM EDT ----- Regarding: DEXA Did she get her DEXA? Ordered for this month

## 2022-07-03 NOTE — Telephone Encounter (Signed)
DEXA is scheduled for Aug 16, 2022

## 2022-07-13 NOTE — Progress Notes (Signed)
[name] [address] [place] [address] Phone: [phone] Subjective:   Bruce Donath, am serving as a scribe for Dr. Antoine Primas.  I'm seeing this patient by the request  of:  Abigail Miyamoto, MD (Inactive)  CC: right hip pain   [id number]  04/18/2022 The patient has had the malignant cancer.  Because patient is having some of the other treatments at the moment given that is GT injection given that did help out significantly.  Hopefully that this will make some improvement.  Does have the postsurgical changes related to continue to monitor.  No pain in the groin so do not feel that further evaluation with x-rays are necessary.  Patient has many different comorbidities that she is still needing to work with and is to undergo a colonoscopy to make sure colon cancer is not reoccurring.  Follow-up with me again in 3 months      Update 07/18/2022 Annalea Alguire is a 88 y.o. female coming in with complaint of R hip pain. Patient states that injections help her pain.       Past Medical History:  Diagnosis Date   Hypothyroidism    Osteoporosis with fracture 05/03/2019   S/P right hip fracture 05/02/2019   Past Surgical History:  Procedure Laterality Date   ABDOMINAL HYSTERECTOMY     w/ bilateral salpingo oophorectomy   APPENDECTOMY     CHOLECYSTECTOMY     COLON SURGERY  2020   right hemicolectomy   REPLACEMENT TOTAL KNEE BILATERAL     TONSILLECTOMY     Social History   Socioeconomic History   Marital status: Widowed    Spouse name: Not on file   Number of children: 0   Years of education: Not on file   Highest education level: Not on file  Occupational History   Not on file  Tobacco Use   Smoking status: Never   Smokeless tobacco: Never  Substance and Sexual Activity   Alcohol use: Never    Alcohol/week: 0.0 standard drinks of alcohol   Drug use: Never   Sexual activity: Not Currently  Other Topics Concern    Not on file  Social History Narrative   Not on file   Social Determinants of Health   Financial Resource Strain: Not on file  Food Insecurity: Not on file  Transportation Needs: Not on file  Physical Activity: Not on file  Stress: Not on file  Social Connections: Not on file   Allergies  Allergen Reactions   Other Anaphylaxis    Hornet sting   No family history on file.  Current Outpatient Medications (Endocrine & Metabolic):    levothyroxine (SYNTHROID) 112 MCG tablet, Take 112 mcg by mouth daily.  Current Outpatient Medications (Cardiovascular):    EPINEPHrine 0.3 mg/0.3 mL IJ SOAJ injection, Inject into the muscle.    Current Outpatient Medications (Hematological):    clopidogrel (PLAVIX) 75 MG tablet, Take 0.5 tablets (37.5 mg total) by mouth daily.    Reviewed prior external information including notes and imaging from  primary care provider As well as notes that were available from care everywhere and other healthcare systems.  Past medical history, social, surgical and family history all reviewed in electronic medical record.  No pertanent information unless stated regarding to the chief complaint.   Review of Systems:  No headache, visual changes, nausea, vomiting, diarrhea, constipation, dizziness, abdominal pain, skin rash, fevers, chills, night sweats, weight loss, swollen lymph nodes, body aches,  joint swelling, chest pain, shortness of breath, mood changes. POSITIVE muscle aches  Objective  Blood pressure 122/72, pulse (!) 52, height 5' 5.5" (1.664 m), weight 117 lb (53.1 kg), SpO2 96 %.   General: No apparent distress alert  HEENT: Pupils equal, extraocular movements intact  Respiratory: Patient's speak in full sentences and does not appear short of breath  Cardiovascular: No lower extremity edema, non tender, no erythema  Antalgic gait using the aid of a cane.  Tender to palpation over the greater trochanteric area more than anywhere else at this time.   Tightness with straight leg test.  Of bilateral knee replacements noted.   After verbal consent patient was prepped with alcohol swab and with a 21-gauge 2 inch needle injected into the right greater trochanteric area with 2 cc of 0.5% Marcaine and 1 cc of Kenalog 40 mg/mL.  No blood loss.  Band-Aid placed.  Postinjection instructions given    Impression and Recommendations:     The above documentation has been reviewed and is accurate and complete Judi Saa, DO

## 2022-07-17 ENCOUNTER — Encounter: Payer: Self-pay | Admitting: Oncology

## 2022-07-18 ENCOUNTER — Ambulatory Visit: Payer: Medicare Other | Admitting: Family Medicine

## 2022-07-18 ENCOUNTER — Encounter: Payer: Self-pay | Admitting: Family Medicine

## 2022-07-18 VITALS — BP 122/72 | HR 52 | Ht 65.5 in | Wt 117.0 lb

## 2022-07-18 DIAGNOSIS — M25551 Pain in right hip: Secondary | ICD-10-CM | POA: Diagnosis not present

## 2022-07-18 NOTE — Patient Instructions (Signed)
Great to see you See me in 3-4 months

## 2022-07-18 NOTE — Assessment & Plan Note (Signed)
Repeat injection given again today.  Tolerated the procedure well.  Discussed can repeat every 3 to 4 months if we need to for this chronic problem with worsening symptoms.  Increase activity slowly.  Follow-up again in 6 to 8 weeks

## 2022-10-30 NOTE — Progress Notes (Deleted)
[name] [address] [place] [address] Phone: [phone] Subjective:    I'm seeing this patient by the request  of:  Abigail Miyamoto, MD (Inactive)  CC: right hip pain   [id number]  07/18/2022 Repeat injection given again today.  Tolerated the procedure well.  Discussed can repeat every 3 to 4 months if we need to for this chronic problem with worsening symptoms.  Increase activity slowly.  Follow-up again in 6 to 8 weeks     Update 10/31/2022 [name] is a 88 y.o. female coming in with complaint of R hip pain.  Last seen for more (greater trochanteric bursitis 7 months ago and given injection.  Patient states        Past Medical History:  Diagnosis Date   Hypothyroidism    Osteoporosis with fracture 05/03/2019   S/P right hip fracture 05/02/2019   Past Surgical History:  Procedure Laterality Date   ABDOMINAL HYSTERECTOMY     w/ bilateral salpingo oophorectomy   APPENDECTOMY     CHOLECYSTECTOMY     COLON SURGERY  2020   right hemicolectomy   REPLACEMENT TOTAL KNEE BILATERAL     TONSILLECTOMY     Social History   Socioeconomic History   Marital status: Widowed    Spouse name: Not on file   Number of children: 0   Years of education: Not on file   Highest education level: Not on file  Occupational History   Not on file  Tobacco Use   Smoking status: Never   Smokeless tobacco: Never  Substance and Sexual Activity   Alcohol use: Never    Alcohol/week: 0.0 standard drinks of alcohol   Drug use: Never   Sexual activity: Not Currently  Other Topics Concern   Not on file  Social History Narrative   Not on file   Social Determinants of Health   Financial Resource Strain: Not on file  Food Insecurity: Not on file  Transportation Needs: Not on file  Physical Activity: Not on file  Stress: Not on file  Social Connections: Not on file   Allergies  Allergen Reactions   Other Anaphylaxis    Hornet  sting   No family history on file.  Current Outpatient Medications (Endocrine & Metabolic):    levothyroxine (SYNTHROID) 112 MCG tablet, Take 112 mcg by mouth daily.  Current Outpatient Medications (Cardiovascular):    EPINEPHrine 0.3 mg/0.3 mL IJ SOAJ injection, Inject into the muscle.    Current Outpatient Medications (Hematological):    clopidogrel (PLAVIX) 75 MG tablet, Take 0.5 tablets (37.5 mg total) by mouth daily.    Reviewed prior external information including notes and imaging from  primary care provider As well as notes that were available from care everywhere and other healthcare systems.  Past medical history, social, surgical and family history all reviewed in electronic medical record.  No pertanent information unless stated regarding to the chief complaint.   Review of Systems:  No headache, visual changes, nausea, vomiting, diarrhea, constipation, dizziness, abdominal pain, skin rash, fevers, chills, night sweats, weight loss, swollen lymph nodes, body aches, joint swelling, chest pain, shortness of breath, mood changes. POSITIVE muscle aches  Objective  There were no vitals taken for this visit.   General: No apparent distress alert and oriented x3 mood and affect normal, dressed appropriately.  HEENT: Pupils equal, extraocular movements intact  Respiratory: Patient's speak in full sentences and does not appear short of breath  Cardiovascular: No  lower extremity edema, non tender, no erythema  Antalgic gait noted Right hip exam shows    Impression and Recommendations:

## 2022-10-31 ENCOUNTER — Ambulatory Visit: Payer: Medicare Other | Admitting: Family Medicine

## 2022-11-10 ENCOUNTER — Telehealth: Payer: Self-pay

## 2022-11-10 ENCOUNTER — Inpatient Hospital Stay: Payer: Medicare Other

## 2022-11-10 ENCOUNTER — Inpatient Hospital Stay: Payer: Medicare Other | Admitting: Hematology and Oncology

## 2022-11-10 ENCOUNTER — Other Ambulatory Visit: Payer: Self-pay | Admitting: Hematology and Oncology

## 2022-11-10 DIAGNOSIS — M810 Age-related osteoporosis without current pathological fracture: Secondary | ICD-10-CM

## 2022-11-10 DIAGNOSIS — C182 Malignant neoplasm of ascending colon: Secondary | ICD-10-CM

## 2022-11-10 NOTE — Telephone Encounter (Signed)
Patient no showed for appointment , no voicemail set up . Called Louise patients sister in law and they will contact patient to reschedule appt.

## 2022-11-13 ENCOUNTER — Telehealth: Payer: Self-pay | Admitting: Oncology

## 2022-11-13 ENCOUNTER — Telehealth: Payer: Self-pay | Admitting: Hematology and Oncology

## 2022-11-13 NOTE — Telephone Encounter (Signed)
11/10/22  Spoke with patient and rescheduled appt

## 2022-11-13 NOTE — Telephone Encounter (Signed)
11/13/22 Spoke with patient and rescheduled all appts per PATIENT REQUEST.

## 2022-11-14 ENCOUNTER — Other Ambulatory Visit: Payer: Medicare Other

## 2022-11-14 ENCOUNTER — Inpatient Hospital Stay: Payer: Medicare Other | Admitting: Hematology and Oncology

## 2022-11-14 ENCOUNTER — Inpatient Hospital Stay: Payer: Medicare Other

## 2022-11-14 ENCOUNTER — Ambulatory Visit: Payer: Medicare Other | Admitting: Hematology and Oncology

## 2022-11-15 ENCOUNTER — Inpatient Hospital Stay: Payer: Medicare Other

## 2022-11-16 ENCOUNTER — Ambulatory Visit: Payer: Medicare Other

## 2022-11-28 ENCOUNTER — Telehealth: Payer: Self-pay | Admitting: Oncology

## 2022-11-28 NOTE — Telephone Encounter (Signed)
11/28/22 Spoke with patient and rescheduled all appts per patients request.

## 2022-11-29 ENCOUNTER — Ambulatory Visit: Payer: Medicare Other | Admitting: Family Medicine

## 2022-11-29 ENCOUNTER — Inpatient Hospital Stay: Payer: Medicare Other

## 2022-11-29 ENCOUNTER — Inpatient Hospital Stay: Payer: Medicare Other | Admitting: Oncology

## 2022-12-01 ENCOUNTER — Ambulatory Visit: Payer: Medicare Other

## 2022-12-06 NOTE — Progress Notes (Unsigned)
[name] [address] [place] [address] Phone: [phone] Subjective:   [name], am serving as a scribe for Dr. Antoine Primas.  I'm seeing this patient by the request  of:  No primary care provider on file.  CC: right hip pain   EXB:[id number]  07/18/2022 Repeat injection given again today. Tolerated the procedure well. Discussed can repeat every 3 to 4 months if we need to for this chronic problem with worsening symptoms. Increase activity slowly. Follow-up again in 6 to 8 weeks   Update 12/07/2022 [name] is a 88 y.o. female coming in with complaint of R hip pain. Patient states that her back and hip pain have increased. Likes to pick up sticks in yard which exacerbates her pain.       Past Medical History:  Diagnosis Date   Hypothyroidism    Osteoporosis with fracture 05/03/2019   S/P right hip fracture 05/02/2019   Past Surgical History:  Procedure Laterality Date   ABDOMINAL HYSTERECTOMY     w/ bilateral salpingo oophorectomy   APPENDECTOMY     CHOLECYSTECTOMY     COLON SURGERY  2020   right hemicolectomy   REPLACEMENT TOTAL KNEE BILATERAL     TONSILLECTOMY     Social History   Socioeconomic History   Marital status: Widowed    Spouse name: Not on file   Number of children: 0   Years of education: Not on file   Highest education level: Not on file  Occupational History   Not on file  Tobacco Use   Smoking status: Never   Smokeless tobacco: Never  Substance and Sexual Activity   Alcohol use: Never    Alcohol/week: 0.0 standard drinks of alcohol   Drug use: Never   Sexual activity: Not Currently  Other Topics Concern   Not on file  Social History Narrative   Not on file   Social Determinants of Health   Financial Resource Strain: Not on file  Food Insecurity: Not on file  Transportation Needs: Not on file  Physical Activity: Not on file  Stress: Not on file  Social Connections: Not on file    Allergies  Allergen Reactions   Other Anaphylaxis    Hornet sting   No family history on file.  Current Outpatient Medications (Endocrine & Metabolic):    levothyroxine (SYNTHROID) 112 MCG tablet, Take 112 mcg by mouth daily.  Current Outpatient Medications (Cardiovascular):    EPINEPHrine 0.3 mg/0.3 mL IJ SOAJ injection, Inject into the muscle.    Current Outpatient Medications (Hematological):    clopidogrel (PLAVIX) 75 MG tablet, Take 0.5 tablets (37.5 mg total) by mouth daily.    Reviewed prior external information including notes and imaging from  primary care provider As well as notes that were available from care everywhere and other healthcare systems.  Past medical history, social, surgical and family history all reviewed in electronic medical record.  No pertanent information unless stated regarding to the chief complaint.   Review of Systems:  No headache, visual changes, nausea, vomiting, diarrhea, constipation, dizziness, abdominal pain, skin rash, fevers, chills, night sweats, weight loss, swollen lymph nodes, body aches, joint swelling, chest pain, shortness of breath, mood changes. POSITIVE muscle aches  Objective  Blood pressure 138/72, height 5' 5.5" (1.664 m), weight 112 lb (50.8 kg).   General: No apparent distress alert and oriented x3 mood and affect normal, dressed appropriately.  HEENT: Pupils equal, extraocular movements intact  Respiratory:  Patient's speak in full sentences and does not appear short of breath  Cardiovascular: No lower extremity edema, non tender, no erythema  Right hip pain noted over the GT patient does have a positive FABER test.  Negative straight leg test.  Back exam though does have some degenerative scoliosis noted.   After verbal consent patient was prepped with alcohol swab and with a 21-gauge 2 inch needle injected into the right greater trochanteric area with 2 cc of 0.5% Marcaine and 1 cc of Kenalog 40 mg/mL.  No blood  loss.  Band-Aid placed.  Postinjection instructions given    Impression and Recommendations:    The above documentation has been reviewed and is accurate and complete Judi Saa, DO

## 2022-12-07 ENCOUNTER — Encounter: Payer: Self-pay | Admitting: Family Medicine

## 2022-12-07 ENCOUNTER — Ambulatory Visit (INDEPENDENT_AMBULATORY_CARE_PROVIDER_SITE_OTHER): Payer: Medicare Other

## 2022-12-07 ENCOUNTER — Ambulatory Visit: Payer: Medicare Other | Admitting: Family Medicine

## 2022-12-07 VITALS — BP 138/72 | Ht 65.5 in | Wt 112.0 lb

## 2022-12-07 DIAGNOSIS — M545 Low back pain, unspecified: Secondary | ICD-10-CM | POA: Diagnosis not present

## 2022-12-07 DIAGNOSIS — M25551 Pain in right hip: Secondary | ICD-10-CM

## 2022-12-07 NOTE — Assessment & Plan Note (Addendum)
Repeat injection given today, patient has tolerated the procedure very well previously.  Hopeful that it will happen again.  Discussed icing regimen and home exercises.  Discussed which activities to do and which ones to avoid.  Increase activity slowly over the course of the next several weeks.  Discussed with patient as well as daughter though that if worsening pain we do need to consider such things as formal physical therapy which I think will be beneficial for strength and confidence.  We also discussed the possibility of advanced imaging if this continues with patient having the history of the malignant neoplasm.

## 2022-12-07 NOTE — Patient Instructions (Addendum)
Xray today Injected hip PT will call you See me again in 3-4 months

## 2022-12-12 ENCOUNTER — Ambulatory Visit: Payer: Medicare Other | Admitting: Oncology

## 2022-12-12 ENCOUNTER — Other Ambulatory Visit: Payer: Medicare Other

## 2022-12-20 ENCOUNTER — Other Ambulatory Visit: Payer: Medicare Other

## 2022-12-20 ENCOUNTER — Ambulatory Visit: Payer: Medicare Other | Admitting: Oncology

## 2022-12-22 ENCOUNTER — Ambulatory Visit: Payer: Medicare Other

## 2023-01-14 ENCOUNTER — Encounter: Payer: Self-pay | Admitting: Oncology

## 2023-01-17 NOTE — Progress Notes (Signed)
[facility]  [address] [place],  [place]  [address] [phone]  Clinic Day: 01/18/23  Referring physician: No ref. provider found   CHIEF COMPLAINT:  CC: Stage IIIB colon cancer, osteoporosis  Current Treatment:  Reclast yearly  HISTORY OF PRESENT ILLNESS:  [name] is a 88 y.o. female with stage IIIB (T3 N1b M0) colon cancer diagnosed in November 2019.  She had never had a colonoscopy before, but had unintentional weight loss of 35 lb over the last year with decreased appetite.  A stool Hemoccult was positive.  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  The right colon revealed a sessile serrated polyp with an area of low-grade dysplasia as well as a tubular adenoma and small fragments of adenocarcinoma.  The left colon also revealed tubular adenomas and a benign hyperplastic polyp.  Small bowel biopsies were benign.  CT scan showed a few tiny subcentimeter lesions in the left hepatic lobe of low-attenuation which appear most consistent with cysts.  There were multiple multiple small gallstones and a small benign appearing left renal cyst.  However, she had an annular soft tissue mass of the ascending colon with several subcentimeter pericolonic nodes up to 9 mm in diameter which were suspicious for metastatic disease.  She was referred to Dr. Georgiana Shore and underwent laparoscopic right hemicolectomy and primary ileocolostomy, as well as a cholecystectomy, in December 2019.  The final pathology revealed 2 separate tumors, 1 measuring 9.5 cm and the other 0.4 cm.  This was a moderately differentiated invasive colorectal adenocarcinoma with mucinous features.  It extended into the pericolonic connective tissue and the smaller tumor extended into the muscularis propria.  There were some signet ring features and 3/35 nodes were positive for metastasis. There was lymphovascular invasion.  Margins were  clear.  The gallbladder revealed cholelithiasis and chronic cholecystitis.  Postop CEA was 8.3, but it was normal by January 2020.  I recommended adjuvant chemotherapy.  She wanted to take oral chemotherapy, so was placed on capecitabine 1000 mg twice daily in February 2020.  Due to toxicities, we eventually lowered her dose to 1000 mg in the morning and 500 mg in the afternoon.  The iron pills made her diarrhea worse, so that was discontinued.  She completed 6 cycles of capecitabine in June 2020.  She underwent colonoscopy to ileocolic anastomosis in December 2020 with Dr. Georgiana Shore. which was negative.  She fell and fractured her right hip at the end of January 2021, and underwent surgery with Dr. Deberah Castle on February 1st, 2021.  We saw her for routine follow-up in April and recommended a bone density scan.  This revealed osteoporosis with a T-score of -2 5 in the left femur, as well as a T-score -1.8 the spine.  We discussed treatment options with the patient via telephone and she opted for yearly IV Reclast, which we had planned to give her at her routine follow-up in July.  She was instructed to start calcium and vitamin-D as well.  She presented to emergency department due to another fall and CT imaging there was a minimally displaced fracture involving the left pubic body, extending into the superior ramus, with an additional fracture of the left pubic root as well, extending into the superior pubic ramus.  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  INTERVAL HISTORY:  [name] is here for routine follow up for stage IIIB colon cancer. Patient states  that she feels well and has no complaints of pain. She has been taking imodium once a day for the past 2-3 weeks which is improving her bowel movements. I suggested that she consume some bananas as they are a great source of potassium and are constipating. She informed me that she has been having itchy breakouts recently. Her last bone density scan from 2021  showed progression from Osteopenia to Osteoporosis. I will schedule a new bone density scan. She will receive a Reclast injection next week. Her labs today are pending and I will add a TSH to that. I recommended that she take a oral calcium supplement 600mg  with vitamin D 1-2 daily. I will see her back in 6 months with CBC, CMP, TSH, and CEA. She denies signs of infection such as sore throat, sinus drainage, cough, or urinary symptoms.  She denies fevers or recurrent chills. She denies pain. She denies nausea, vomiting, chest pain, dyspnea or cough. Her appetite is on and off and her weight has decreased 1 pounds over last 1.5 months . She is accompanied by Wilkie Aye at today's visit.   REVIEW OF SYSTEMS:  Review of Systems  Constitutional:  Positive for fatigue. Negative for appetite change, chills, diaphoresis, fever and unexpected weight change.       Cold intolerance  HENT:  Negative.  Negative for hearing loss, lump/mass, mouth sores, nosebleeds, sore throat, tinnitus, trouble swallowing and voice change.   Eyes: Negative.  Negative for eye problems and icterus.  Respiratory: Negative.  Negative for chest tightness, cough, hemoptysis, shortness of breath and wheezing.   Cardiovascular: Negative.  Negative for chest pain, leg swelling and palpitations.  Gastrointestinal:  Positive for diarrhea. Negative for abdominal distention, abdominal pain, blood in stool, constipation, nausea, rectal pain and vomiting.       Frequent bowel movements, chronic since surgery  Endocrine: Negative.   Genitourinary: Negative.  Negative for bladder incontinence, difficulty urinating, dyspareunia, dysuria, frequency, hematuria, menstrual problem, nocturia, pelvic pain, vaginal bleeding and vaginal discharge.   Musculoskeletal: Negative.  Negative for arthralgias, back pain, flank pain, gait problem, myalgias, neck pain and neck stiffness.       Left hip pain  Skin: Negative.  Negative for itching, rash and wound.   Neurological: Negative.  Negative for dizziness, extremity weakness, gait problem, headaches, light-headedness, numbness, seizures and speech difficulty.  Hematological: Negative.  Negative for adenopathy. Does not bruise/bleed easily.  Psychiatric/Behavioral:  Positive for confusion. Negative for decreased concentration, depression, sleep disturbance and suicidal ideas. The patient is not nervous/anxious.     VITALS:  Blood pressure 118/85, pulse 66, temperature 97.8 F (36.6 C), temperature source Oral, resp. rate 18, height 5' 5.5" (1.664 m), weight 110 lb 14.4 oz (50.3 kg), SpO2 97%.  Wt Readings from Last 3 Encounters:  01/25/23 109 lb (49.4 kg)  01/18/23 110 lb 14.4 oz (50.3 kg)  12/07/22 112 lb (50.8 kg)    Body mass index is 18.17 kg/m.  Performance status (ECOG): 0 - Asymptomatic  PHYSICAL EXAM:  Physical Exam Vitals and nursing note reviewed. Exam conducted with a chaperone present.  Constitutional:      General: She is not in acute distress.    Appearance: Normal appearance. She is normal weight. She is not ill-appearing, toxic-appearing or diaphoretic.  HENT:     Head: Normocephalic and atraumatic.     Right Ear: Tympanic membrane, ear canal and external ear normal. There is no impacted cerumen.     Left Ear: Tympanic membrane, ear  canal and external ear normal. There is no impacted cerumen.     Nose: Nose normal. No congestion or rhinorrhea.     Mouth/Throat:     Mouth: Mucous membranes are moist.     Pharynx: Oropharynx is clear. No oropharyngeal exudate or posterior oropharyngeal erythema.  Eyes:     General: No scleral icterus.       Right eye: No discharge.        Left eye: No discharge.     Extraocular Movements: Extraocular movements intact.     Conjunctiva/sclera: Conjunctivae normal.     Pupils: Pupils are equal, round, and reactive to light.  Neck:     Vascular: No carotid bruit.  Cardiovascular:     Rate and Rhythm: Normal rate and regular rhythm.      Pulses: Normal pulses.     Heart sounds: Normal heart sounds. No murmur heard.    No friction rub. No gallop.  Pulmonary:     Effort: Pulmonary effort is normal. No respiratory distress.     Breath sounds: Normal breath sounds. No stridor. No wheezing, rhonchi or rales.  Chest:     Chest wall: No tenderness.  Abdominal:     General: Bowel sounds are normal. There is no distension.     Palpations: Abdomen is soft. There is no hepatomegaly, splenomegaly or mass.     Tenderness: There is no abdominal tenderness. There is no right CVA tenderness, left CVA tenderness, guarding or rebound.     Hernia: No hernia is present.  Musculoskeletal:        General: No swelling, tenderness, deformity or signs of injury. Normal range of motion.     Cervical back: Normal range of motion and neck supple. No rigidity or tenderness.     Right lower leg: No edema.     Left lower leg: No edema.  Lymphadenopathy:     Cervical: No cervical adenopathy.  Skin:    General: Skin is warm and dry.     Coloration: Skin is not jaundiced or pale.     Findings: No bruising, erythema, lesion or rash.  Neurological:     General: No focal deficit present.     Mental Status: She is alert and oriented to person, place, and time. Mental status is at baseline.     Cranial Nerves: No cranial nerve deficit.     Sensory: No sensory deficit.     Motor: No weakness.     Coordination: Coordination normal.     Gait: Gait normal.     Deep Tendon Reflexes: Reflexes normal.  Psychiatric:        Mood and Affect: Mood normal.        Behavior: Behavior normal.        Thought Content: Thought content normal.        Judgment: Judgment normal.    LABS:      Latest Ref Rng & Units 01/18/2023   10:13 AM 06/14/2022    3:01 PM 01/23/2022    1:03 PM  CBC  WBC 4.0 - 10.5 K/uL 6.1  6.9  7.5   Hemoglobin 12.0 - 15.0 g/dL 16.1  09.6  04.5   Hematocrit 36.0 - 46.0 % 43.1  43.0  43.0   Platelets 150 - 400 K/uL 264  269  279        Latest Ref Rng & Units 01/18/2023   10:13 AM 06/14/2022    3:01 PM 01/23/2022    1:03 PM  CMP  Glucose 70 - 99 mg/dL 90  94  96   BUN 8 - 23 mg/dL 8  8  10    Creatinine 0.44 - 1.00 mg/dL 1.82  9.93  7.16   Sodium 135 - 145 mmol/L 141  138  139   Potassium 3.5 - 5.1 mmol/L 3.5  3.4  4.0   Chloride 98 - 111 mmol/L 106  105  106   CO2 22 - 32 mmol/L 26  24  25    Calcium 8.9 - 10.3 mg/dL 9.5  9.1  9.0   Total Protein 6.5 - 8.1 g/dL 6.9  7.1  7.0   Total Bilirubin 0.3 - 1.2 mg/dL 0.5  0.4  0.4   Alkaline Phos 38 - 126 U/L 69  60  54   AST 15 - 41 U/L 19  18  17    ALT 0 - 44 U/L 12  11  13     Lab Results  Component Value Date   CEA1 2.1 01/18/2023   CEA 1.6 06/25/2019   /  CEA  Date Value Ref Range Status  01/18/2023 2.1 0.0 - 4.7 ng/mL Final    Comment:    (NOTE)                             Nonsmokers          <3.9                             Smokers             <5.6 [employer] Electrochemiluminescence Immunoassay (ECLIA) Values obtained with different assay methods or kits cannot be used interchangeably.  Results cannot be interpreted as absolute evidence of the presence or absence of malignant disease. Performed At: [facility] [address] [place], [place] [id number] Jolene Schimke MD FB:[phone]   06/25/2019 1.6  Final     STUDIES:  No results found.    Allergies:  Allergies  Allergen Reactions   Other Anaphylaxis    Hornet sting    Current Medications: Current Outpatient Medications  Medication Sig Dispense Refill   Cholecalciferol (VITAMIN D3) 250 MCG (10000 UT) capsule Take 10,000 Units by mouth daily.     clopidogrel (PLAVIX) 75 MG tablet Take 0.5 tablets (37.5 mg total) by mouth daily. (Patient not taking: Reported on 01/25/2023) 45 tablet 2   EPINEPHrine 0.3 mg/0.3 mL IJ SOAJ injection Inject into the muscle.     levothyroxine (SYNTHROID) 112 MCG tablet Take 112 mcg by mouth daily.     No current facility-administered medications  for this visit.     ASSESSMENT & PLAN:  Assessment:   1. Stage IIIB colon cancer diagnosed in November 2019.  She was treated with surgery and adjuvant capecitabine.  She remains without evidence of recurrence.  2. Osteoporosis with history of right hip fracture, left pubic fracture and multiple fractures of her foot.  The patient was started on Reclast in late August 2022, and had her second dose on August 25.  The patient started calcium and vitamin-D and will continue it at least daily. She is due for repeat bone density scan so I will get that scheduled.  3. Hypothyroidism but her TSH was in the normal range in August 2022. I will repeat today.   Plan: She has been taking imodium once a day for the past 2-3 weeks which is improving her  bowel movements. I suggested that she consume some bananas as they are a great source of potassium and are constipating. She informed me that she has been having itchy breakouts recently. Her last bone density scan from 2021 showed progression from Osteopenia to Osteoporosis. I will schedule a new bone density scan. She will receive a Reclast injection next week. Her labs today are pending and I will add a TSH to that. I recommended that she take a oral calcium supplement 600mg  with vitamin D 1-2 daily. I will see her back in 6 months with CBC, CMP, TSH, and CEA. The patient understands the plans discussed today and is in agreement with them.  She knows to contact our office if she develops concerns prior to her next appointment.   I provided 23 minutes of face-to-face time during this this encounter and > 50% was spent counseling as documented under my assessment and plan.    Dellia Beckwith, MD [facility] [address] [place] [address] Dept: [phone] Dept Fax: [phone]    Rulon Sera Lassiter,acting as a scribe for Dellia Beckwith, MD.,have documented all relevant  documentation on the behalf of Dellia Beckwith, MD,as directed by  Dellia Beckwith, MD while in the presence of Dellia Beckwith, MD.

## 2023-01-18 ENCOUNTER — Encounter: Payer: Self-pay | Admitting: Oncology

## 2023-01-18 ENCOUNTER — Telehealth: Payer: Self-pay

## 2023-01-18 ENCOUNTER — Inpatient Hospital Stay: Payer: Medicare Other | Attending: Oncology

## 2023-01-18 ENCOUNTER — Other Ambulatory Visit: Payer: Self-pay

## 2023-01-18 ENCOUNTER — Other Ambulatory Visit: Payer: Self-pay | Admitting: Oncology

## 2023-01-18 ENCOUNTER — Inpatient Hospital Stay: Payer: Medicare Other | Admitting: Oncology

## 2023-01-18 VITALS — BP 118/85 | HR 66 | Temp 97.8°F | Resp 18 | Ht 65.5 in | Wt 110.9 lb

## 2023-01-18 DIAGNOSIS — C182 Malignant neoplasm of ascending colon: Secondary | ICD-10-CM | POA: Diagnosis not present

## 2023-01-18 DIAGNOSIS — E038 Other specified hypothyroidism: Secondary | ICD-10-CM

## 2023-01-18 DIAGNOSIS — E039 Hypothyroidism, unspecified: Secondary | ICD-10-CM | POA: Insufficient documentation

## 2023-01-18 DIAGNOSIS — Z85038 Personal history of other malignant neoplasm of large intestine: Secondary | ICD-10-CM | POA: Insufficient documentation

## 2023-01-18 LAB — CBC WITH DIFFERENTIAL (CANCER CENTER ONLY)
Abs Immature Granulocytes: 0.02 10*3/uL (ref 0.00–0.07)
Basophils Absolute: 0.1 10*3/uL (ref 0.0–0.1)
Basophils Relative: 1 %
Eosinophils Absolute: 0.1 10*3/uL (ref 0.0–0.5)
Eosinophils Relative: 2 %
HCT: 43.1 % (ref 36.0–46.0)
Hemoglobin: 13.6 g/dL (ref 12.0–15.0)
Immature Granulocytes: 0 %
Lymphocytes Relative: 32 %
Lymphs Abs: 1.9 10*3/uL (ref 0.7–4.0)
MCH: 30.2 pg (ref 26.0–34.0)
MCHC: 31.6 g/dL (ref 30.0–36.0)
MCV: 95.8 fL (ref 80.0–100.0)
Monocytes Absolute: 0.6 10*3/uL (ref 0.1–1.0)
Monocytes Relative: 10 %
Neutro Abs: 3.3 10*3/uL (ref 1.7–7.7)
Neutrophils Relative %: 55 %
Platelet Count: 264 10*3/uL (ref 150–400)
RBC: 4.5 MIL/uL (ref 3.87–5.11)
RDW: 13.3 % (ref 11.5–15.5)
WBC Count: 6.1 10*3/uL (ref 4.0–10.5)
nRBC: 0 % (ref 0.0–0.2)

## 2023-01-18 LAB — CMP (CANCER CENTER ONLY)
ALT: 12 U/L (ref 0–44)
AST: 19 U/L (ref 15–41)
Albumin: 4.1 g/dL (ref 3.5–5.0)
Alkaline Phosphatase: 69 U/L (ref 38–126)
Anion gap: 9 (ref 5–15)
BUN: 8 mg/dL (ref 8–23)
CO2: 26 mmol/L (ref 22–32)
Calcium: 9.5 mg/dL (ref 8.9–10.3)
Chloride: 106 mmol/L (ref 98–111)
Creatinine: 0.83 mg/dL (ref 0.44–1.00)
GFR, Estimated: >60 mL/min
Glucose, Bld: 90 mg/dL (ref 70–99)
Potassium: 3.5 mmol/L (ref 3.5–5.1)
Sodium: 141 mmol/L (ref 135–145)
Total Bilirubin: 0.5 mg/dL (ref 0.3–1.2)
Total Protein: 6.9 g/dL (ref 6.5–8.1)

## 2023-01-18 LAB — TSH: TSH: 13.813 u[IU]/mL — ABNORMAL HIGH (ref 0.350–4.500)

## 2023-01-18 NOTE — Telephone Encounter (Signed)
-----   Message from Dellia Beckwith sent at 01/17/2023  5:56 PM EDT ----- Regarding: DEXA Did she have a DEXA this year?

## 2023-01-18 NOTE — Telephone Encounter (Signed)
Do not see one done this year. Will ask patient when checking her in for visit.

## 2023-01-19 LAB — CEA: CEA: 2.1 ng/mL (ref 0.0–4.7)

## 2023-01-23 ENCOUNTER — Telehealth: Payer: Self-pay | Admitting: Oncology

## 2023-01-23 NOTE — Telephone Encounter (Signed)
Patient has been scheduled. Aware of appt date and time.   Scheduling Message Entered by Gery Pray H on 01/18/2023 at 11:38 AM Priority: High <No visit type provided>  Department: [facility]  Provider:  Scheduling Notes:  Pls sched DEXA after the first of the year (or wheever we can do in new center)    RT 6 months with labs    Pls schedule all appts through Christy   [phone]    She already has Reclast appt for next week

## 2023-01-25 ENCOUNTER — Encounter: Payer: Self-pay | Admitting: Oncology

## 2023-01-25 ENCOUNTER — Inpatient Hospital Stay: Payer: Medicare Other | Attending: Oncology

## 2023-01-25 VITALS — BP 141/81 | HR 61 | Temp 98.2°F | Resp 20 | Ht 65.5 in | Wt 109.0 lb

## 2023-01-25 DIAGNOSIS — M810 Age-related osteoporosis without current pathological fracture: Secondary | ICD-10-CM | POA: Diagnosis present

## 2023-01-25 DIAGNOSIS — Z85038 Personal history of other malignant neoplasm of large intestine: Secondary | ICD-10-CM | POA: Insufficient documentation

## 2023-01-25 MED ORDER — SODIUM CHLORIDE 0.9 % IV SOLN
Freq: Once | INTRAVENOUS | Status: AC
Start: 2023-01-25 — End: 2023-01-25

## 2023-01-25 MED ORDER — ZOLEDRONIC ACID 4 MG/100ML IV SOLN
4.0000 mg | Freq: Once | INTRAVENOUS | Status: AC
  Administered 2023-01-25: 4 mg via INTRAVENOUS
  Filled 2023-01-25: qty 100

## 2023-01-25 NOTE — Progress Notes (Signed)
Per [name] phy, rph, pt's tsh was very high when she had her labs drawn. Pt confirms that she is taking her synthroid.  Pt and pt's niece (her care taker) advised that she needs to make an appt with her primary care dr to have synthroid adjusted.  Both verbalize understanding.

## 2023-01-25 NOTE — Addendum Note (Signed)
Addended by: Domenic Schwab on: 01/25/2023 09:18 AM   Modules accepted: Orders

## 2023-01-25 NOTE — Patient Instructions (Signed)

## 2023-02-15 ENCOUNTER — Encounter: Payer: Self-pay | Admitting: Oncology

## 2023-02-19 ENCOUNTER — Telehealth: Payer: Self-pay

## 2023-02-19 NOTE — Telephone Encounter (Signed)
-----   Message from Dellia Beckwith sent at 02/19/2023 10:03 AM EST ----- Regarding: labs She needs to have f/u on thyroid tests, we told her last month.  Did she get with her PCP? Do we need to send her labs?  Or have TSH repeated here?

## 2023-02-19 NOTE — Telephone Encounter (Signed)
Spoke with Wilkie Aye the niece and she states that patients lab appt has not been set up yet due to a death in the family. Wilkie Aye states she will call and get it scheduled once the funeral and everything related to it is settled.

## 2023-03-07 NOTE — Progress Notes (Unsigned)
[name] [address] [place] [address] Phone: [phone] Subjective:   [name], am serving as a scribe for Dr. Antoine Primas.  I'm seeing this patient by the request  of:  Practice, [facility]  CC: Right hip pain follow-up  [id number]  12/07/2022 Repeat injection given today, patient has tolerated the procedure very well previously. Hopeful that it will happen again. Discussed icing regimen and home exercises. Discussed which activities to do and which ones to avoid. Increase activity slowly over the course of the next several weeks. Discussed with patient as well as daughter though that if worsening pain we do need to consider such things as formal physical therapy which I think will be beneficial for strength and confidence. We also discussed the possibility of advanced imaging if this continues with patient having the history of the malignant neoplasm.   Update 03/08/2023 [name] is a 88 y.o. female coming in with complaint of R hip pain. Patient states that she feels relief everywhere from injection.        Past Medical History:  Diagnosis Date   Hypothyroidism    Osteoporosis with fracture 05/03/2019   S/P right hip fracture 05/02/2019   Past Surgical History:  Procedure Laterality Date   ABDOMINAL HYSTERECTOMY     w/ bilateral salpingo oophorectomy   APPENDECTOMY     CHOLECYSTECTOMY     COLON SURGERY  2020   right hemicolectomy   REPLACEMENT TOTAL KNEE BILATERAL     TONSILLECTOMY     Social History   Socioeconomic History   Marital status: Widowed    Spouse name: Not on file   Number of children: 0   Years of education: Not on file   Highest education level: Not on file  Occupational History   Not on file  Tobacco Use   Smoking status: Never   Smokeless tobacco: Never  Substance and Sexual Activity   Alcohol use: Never    Alcohol/week: 0.0 standard drinks of alcohol   Drug use:  Never   Sexual activity: Not Currently  Other Topics Concern   Not on file  Social History Narrative   Not on file   Social Drivers of Health   Financial Resource Strain: Not on file  Food Insecurity: Not on file  Transportation Needs: Not on file  Physical Activity: Not on file  Stress: Not on file  Social Connections: Not on file   Allergies  Allergen Reactions   Other Anaphylaxis    Hornet sting   No family history on file.  Current Outpatient Medications (Endocrine & Metabolic):    levothyroxine (SYNTHROID) 112 MCG tablet, Take 112 mcg by mouth daily.  Current Outpatient Medications (Cardiovascular):    EPINEPHrine 0.3 mg/0.3 mL IJ SOAJ injection, Inject into the muscle.    Current Outpatient Medications (Hematological):    clopidogrel (PLAVIX) 75 MG tablet, Take 0.5 tablets (37.5 mg total) by mouth daily.  Current Outpatient Medications (Other):    Cholecalciferol (VITAMIN D3) 250 MCG (10000 UT) capsule, Take 10,000 Units by mouth daily.   Reviewed prior external information including notes and imaging from  primary care provider As well as notes that were available from care everywhere and other healthcare systems.  Past medical history, social, surgical and family history all reviewed in electronic medical record.  No pertanent information unless stated regarding to the chief complaint.   Review of Systems:  No headache, visual changes, nausea, vomiting, diarrhea, constipation, dizziness,  abdominal pain, skin rash, fevers, chills, night sweats, weight loss, swollen lymph nodes, body aches, joint swelling, chest pain, shortness of breath, mood changes. POSITIVE muscle aches  Objective  Blood pressure 118/74, pulse 78, height 5' 5.5" (1.664 m), weight 109 lb (49.4 kg), SpO2 98%.   General: No apparent distress alert and oriented x3 mood and affect normal, dressed appropriately.  HEENT: Pupils equal, extraocular movements intact  Respiratory: Patient's speak in  full sentences and does not appear short of breath  Cardiovascular: No lower extremity edema, non tender, no erythema  Hip exam shows some postsurgical changes noted on the lateral aspect of the hip.  Patient is tender to palpation over the greater trochanteric area.  Positive FABER test, negative straight leg test.   After verbal consent patient was prepped with alcohol swab and with a 21-gauge 2 inch needle injected into the right greater trochanteric area with 2 cc of 0.5% Marcaine and 1 cc of Kenalog 40 mg/mL.  No blood loss.  Band-Aid placed.  Postinjection instructions given.    Impression and Recommendations:     The above documentation has been reviewed and is accurate and complete Judi Saa, DO

## 2023-03-08 ENCOUNTER — Ambulatory Visit: Payer: Medicare Other | Admitting: Family Medicine

## 2023-03-08 ENCOUNTER — Encounter: Payer: Self-pay | Admitting: Family Medicine

## 2023-03-08 VITALS — BP 118/74 | HR 78 | Ht 65.5 in | Wt 109.0 lb

## 2023-03-08 DIAGNOSIS — M25551 Pain in right hip: Secondary | ICD-10-CM | POA: Diagnosis not present

## 2023-03-08 NOTE — Patient Instructions (Addendum)
Injected hip today See me again in 3 months

## 2023-03-08 NOTE — Assessment & Plan Note (Signed)
Repeat injection given again today.  Tolerated the procedure well, discussed icing regimen and home exercises, increase activity slowly over the course of next several weeks.  Follow-up with me again in 3 months otherwise.  Social determinants of health include patient's is not extremely active and unable to walk a significant amount without her cane.  Attempting to be more active is her goal for 2025

## 2023-05-07 ENCOUNTER — Ambulatory Visit (HOSPITAL_BASED_OUTPATIENT_CLINIC_OR_DEPARTMENT_OTHER)
Admission: RE | Admit: 2023-05-07 | Discharge: 2023-05-07 | Disposition: A | Discharge status: Home / Self Care | Payer: Medicare Other | Source: Ambulatory Visit | Attending: Oncology | Admitting: Oncology

## 2023-05-07 ENCOUNTER — Encounter (HOSPITAL_BASED_OUTPATIENT_CLINIC_OR_DEPARTMENT_OTHER): Payer: Self-pay | Admitting: Radiology

## 2023-05-07 DIAGNOSIS — M810 Age-related osteoporosis without current pathological fracture: Secondary | ICD-10-CM | POA: Diagnosis not present

## 2023-05-08 ENCOUNTER — Other Ambulatory Visit (HOSPITAL_BASED_OUTPATIENT_CLINIC_OR_DEPARTMENT_OTHER): Payer: Medicare Other | Admitting: Radiology

## 2023-05-09 ENCOUNTER — Other Ambulatory Visit (HOSPITAL_BASED_OUTPATIENT_CLINIC_OR_DEPARTMENT_OTHER): Payer: Medicare Other | Admitting: Radiology

## 2023-06-06 ENCOUNTER — Ambulatory Visit: Payer: Medicare Other | Admitting: Family Medicine

## 2023-07-17 NOTE — Progress Notes (Unsigned)
 [name] [address] [place] [address] Phone: [phone] Subjective:   IBryan Todd, am serving as a scribe for Dr. Ronnell Coins.  I'm seeing this patient by the request  of:  Practice, [facility]  CC: Right hip pain follow-up  [id number]  03/08/2023 Repeat injection given again today.  Tolerated the procedure well, discussed icing regimen and home exercises, increase activity slowly over the course of next several weeks.  Follow-up with me again in 3 months otherwise.  Social determinants of health include patient's is not extremely active and unable to walk a significant amount without her cane.  Attempting to be more active is her goal for 2025      Update 07/18/2023 [name] is a 89 y.o. female coming in with complaint of R hip pain.  Has responded well to injections previously.  Last injection was nearly 5 months ago.  Patient states hip pain getting a little worse. Complaining a little more.    Since we have seen her she has been seen by general surgery who recommended any patient have an EGD and colonoscopy in January.  Past Medical History:  Diagnosis Date   Hypothyroidism    Osteoporosis with fracture 05/03/2019   S/P right hip fracture 05/02/2019   Past Surgical History:  Procedure Laterality Date   ABDOMINAL HYSTERECTOMY     w/ bilateral salpingo oophorectomy   APPENDECTOMY     CHOLECYSTECTOMY     COLON SURGERY  2020   right hemicolectomy   REPLACEMENT TOTAL KNEE BILATERAL     TONSILLECTOMY     Social History   Socioeconomic History   Marital status: Widowed    Spouse name: Not on file   Number of children: 0   Years of education: Not on file   Highest education level: Not on file  Occupational History   Not on file  Tobacco Use   Smoking status: Never   Smokeless tobacco: Never  Substance and Sexual Activity   Alcohol use: Never    Alcohol/week: 0.0 standard drinks of alcohol    Drug use: Never   Sexual activity: Not Currently  Other Topics Concern   Not on file  Social History Narrative   Not on file   Social Drivers of Health   Financial Resource Strain: Not on file  Food Insecurity: Not on file  Transportation Needs: Not on file  Physical Activity: Not on file  Stress: Not on file  Social Connections: Not on file   Allergies  Allergen Reactions   Other Anaphylaxis    Hornet sting   No family history on file.  Current Outpatient Medications (Endocrine & Metabolic):    levothyroxine  (SYNTHROID ) 112 MCG tablet, Take 112 mcg by mouth daily.  Current Outpatient Medications (Cardiovascular):    EPINEPHrine 0.3 mg/0.3 mL IJ SOAJ injection, Inject into the muscle.    Current Outpatient Medications (Hematological):    clopidogrel  (PLAVIX ) 75 MG tablet, Take 0.5 tablets (37.5 mg total) by mouth daily.  Current Outpatient Medications (Other):    Cholecalciferol (VITAMIN D3) 250 MCG (10000 UT) capsule, Take 10,000 Units by mouth daily.   Reviewed prior external information including notes and imaging from  primary care provider As well as notes that were available from care everywhere and other healthcare systems.  Past medical history, social, surgical and family history all reviewed in electronic medical record.  No pertanent information unless stated regarding to the chief complaint.   Review of  Systems:  No headache, visual changes, nausea, vomiting, diarrhea, constipation, dizziness, abdominal pain, skin rash, fevers, chills, night sweats, weight loss, swollen lymph nodes, body aches, joint swelling, chest pain, shortness of breath, mood changes. POSITIVE muscle aches  Objective  Blood pressure 128/82, pulse 70, height 5\' 5"  (1.651 m), weight 106 lb (48.1 kg), SpO2 98%.   General: No apparent distress alert and oriented x3 mood and affect normal, dressed appropriately.  HEENT: Pupils equal, extraocular movements intact  Respiratory: Patient's  speak in full sentences and does not appear short of breath  Cardiovascular: No lower extremity edema, non tender, no erythema  Hip exam shows tender to palpation over the right greater trochanteric area.  Positive FABER test noted.    After verbal consent patient was prepped with alcohol swab and with a 21-gauge 2 inch needle injected into the right greater trochanteric area with 2 cc of 0.5% Marcaine and 1 cc of Kenalog  40 mg/mL.  No blood loss.  Band-Aid placed.  Postinjection instructions given    Impression and Recommendations:     The above documentation has been reviewed and is accurate and complete Quency Tober M Bemnet Trovato, DO

## 2023-07-18 ENCOUNTER — Encounter: Payer: Self-pay | Admitting: Family Medicine

## 2023-07-18 ENCOUNTER — Ambulatory Visit: Admitting: Family Medicine

## 2023-07-18 VITALS — BP 128/82 | HR 70 | Ht 65.0 in | Wt 106.0 lb

## 2023-07-18 DIAGNOSIS — M25551 Pain in right hip: Secondary | ICD-10-CM | POA: Diagnosis not present

## 2023-07-18 NOTE — Patient Instructions (Signed)
 Injection in hip today Good to see you! See you again in 3 months

## 2023-07-20 NOTE — Assessment & Plan Note (Signed)
 Chronic problem with exacerbation.  Due to comorbidities patient is not a surgical candidate.  Responds extremely well though to the injections.  Discussed icing regimen and home exercises, discussed which activities to do and which ones to avoid.  Patient is accompanied with family.  Follow-up again in 12 weeks

## 2023-07-24 NOTE — Progress Notes (Incomplete)
 [facility]  [address] [place],  [place]  [address] [phone]  Clinic Day: 07/24/23   Referring physician: Practice, Desiree Florence*   CHIEF COMPLAINT:  CC: Stage IIIB colon cancer, osteoporosis  Current Treatment:  Reclast  yearly  HISTORY OF PRESENT ILLNESS:  [name] is a 89 y.o. female with stage IIIB (T3 N1b M0) colon cancer diagnosed in November 2019.  She had never had a colonoscopy before, but had unintentional weight loss of 35 lb over the last year with decreased appetite.  A stool Hemoccult was positive.  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  The right colon revealed a sessile serrated polyp with an area of low-grade dysplasia as well as a tubular adenoma and small fragments of adenocarcinoma.  The left colon also revealed tubular adenomas and a benign hyperplastic polyp.  Small bowel biopsies were benign.  CT scan showed a few tiny subcentimeter lesions in the left hepatic lobe of low-attenuation which appear most consistent with cysts.  There were multiple multiple small gallstones and a small benign appearing left renal cyst.  However, she had an annular soft tissue mass of the ascending colon with several subcentimeter pericolonic nodes up to 9 mm in diameter which were suspicious for metastatic disease.  She was referred to Dr. Jonita Neth and underwent laparoscopic right hemicolectomy and primary ileocolostomy, as well as a cholecystectomy, in December 2019.  The final pathology revealed 2 separate tumors, 1 measuring 9.5 cm and the other 0.4 cm.  This was a moderately differentiated invasive colorectal adenocarcinoma with mucinous features.  It extended into the pericolonic connective tissue and the smaller tumor extended into the muscularis propria.  There were some signet ring features and 3/35 nodes were positive for metastasis. There was lymphovascular invasion.  Margins were clear.  The  gallbladder revealed cholelithiasis and chronic cholecystitis.  Postop CEA was 8.3, but it was normal by January 2020.  I recommended adjuvant chemotherapy.  She wanted to take oral chemotherapy, so was placed on capecitabine 1000 mg twice daily in February 2020.  Due to toxicities, we eventually lowered her dose to 1000 mg in the morning and 500 mg in the afternoon.  The iron pills made her diarrhea worse, so that was discontinued.  She completed 6 cycles of capecitabine in June 2020.  She underwent colonoscopy to ileocolic anastomosis in December 2020 with Dr. Jonita Neth. which was negative.  She fell and fractured her right hip at the end of January 2021, and underwent surgery with Dr. Rolinda Climes on February 1st, 2021.  We saw her for routine follow-up in April and recommended a bone density scan.  This revealed osteoporosis with a T-score of -2 5 in the left femur, as well as a T-score -1.8 the spine.  We discussed treatment options with the patient via telephone and she opted for yearly IV Reclast , which we had planned to give her at her routine follow-up in July.  She was instructed to start calcium and vitamin-D as well.  She presented to emergency department due to another fall and CT imaging there was a minimally displaced fracture involving the left pubic body, extending into the superior ramus, with an additional fracture of the left pubic root as well, extending into the superior pubic ramus.  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  INTERVAL HISTORY:  [name] is here for routine follow up for stage IIIB colon cancer. Patient states that she  feels *** and ***.      She denies fever, chills, night sweats, or other signs of infection. She denies cardiorespiratory and gastrointestinal issues. She  denies pain. Her appetite is *** and her weight {Weight change:10426}.  Patient states that she feels well and has no complaints of pain. She has been taking imodium once a day for the past 2-3 weeks  which is improving her bowel movements. I suggested that she consume some bananas as they are a great source of potassium and are constipating. She informed me that she has been having itchy breakouts recently. Her last bone density scan from 2021 showed progression from Osteopenia to Osteoporosis. I will schedule a new bone density scan. She will receive a Reclast  injection next week. Her labs today are pending and I will add a TSH to that. I recommended that she take a oral calcium supplement 600mg  with vitamin D  1-2 daily. I will see her back in 6 months with CBC, CMP, TSH, and CEA. She denies signs of infection such as sore throat, sinus drainage, cough, or urinary symptoms.  She denies fevers or recurrent chills. She denies pain. She denies nausea, vomiting, chest pain, dyspnea or cough. Her appetite is on and off and her weight has decreased 1 pounds over last 1.5 months . She is accompanied by Ira Mann at today's visit.   REVIEW OF SYSTEMS:  Review of Systems  Constitutional:  Positive for fatigue. Negative for appetite change, chills, diaphoresis, fever and unexpected weight change.       Cold intolerance  HENT:  Negative.  Negative for hearing loss, lump/mass, mouth sores, nosebleeds, sore throat, tinnitus, trouble swallowing and voice change.   Eyes: Negative.  Negative for eye problems and icterus.  Respiratory: Negative.  Negative for chest tightness, cough, hemoptysis, shortness of breath and wheezing.   Cardiovascular: Negative.  Negative for chest pain, leg swelling and palpitations.  Gastrointestinal:  Positive for diarrhea. Negative for abdominal distention, abdominal pain, blood in stool, constipation, nausea, rectal pain and vomiting.       Frequent bowel movements, chronic since surgery  Endocrine: Negative.   Genitourinary: Negative.  Negative for bladder incontinence, difficulty urinating, dyspareunia, dysuria, frequency, hematuria, menstrual problem, nocturia, pelvic pain, vaginal  bleeding and vaginal discharge.   Musculoskeletal: Negative.  Negative for arthralgias, back pain, flank pain, gait problem, myalgias, neck pain and neck stiffness.       Left hip pain  Skin: Negative.  Negative for itching, rash and wound.  Neurological: Negative.  Negative for dizziness, extremity weakness, gait problem, headaches, light-headedness, numbness, seizures and speech difficulty.  Hematological: Negative.  Negative for adenopathy. Does not bruise/bleed easily.  Psychiatric/Behavioral:  Positive for confusion. Negative for decreased concentration, depression, sleep disturbance and suicidal ideas. The patient is not nervous/anxious.     VITALS:  There were no vitals taken for this visit.  Wt Readings from Last 3 Encounters:  07/18/23 106 lb (48.1 kg)  03/08/23 109 lb (49.4 kg)  01/25/23 109 lb (49.4 kg)    There is no height or weight on file to calculate BMI.  Performance status (ECOG): 0 - Asymptomatic  PHYSICAL EXAM:  Physical Exam Vitals and nursing note reviewed. Exam conducted with a chaperone present.  Constitutional:      General: She is not in acute distress.    Appearance: Normal appearance. She is normal weight. She is not ill-appearing, toxic-appearing or diaphoretic.  HENT:     Head: Normocephalic and atraumatic.     Right  Ear: Tympanic membrane, ear canal and external ear normal. There is no impacted cerumen.     Left Ear: Tympanic membrane, ear canal and external ear normal. There is no impacted cerumen.     Nose: Nose normal. No congestion or rhinorrhea.     Mouth/Throat:     Mouth: Mucous membranes are moist.     Pharynx: Oropharynx is clear. No oropharyngeal exudate or posterior oropharyngeal erythema.  Eyes:     General: No scleral icterus.       Right eye: No discharge.        Left eye: No discharge.     Extraocular Movements: Extraocular movements intact.     Conjunctiva/sclera: Conjunctivae normal.     Pupils: Pupils are equal, round, and  reactive to light.  Neck:     Vascular: No carotid bruit.  Cardiovascular:     Rate and Rhythm: Normal rate and regular rhythm.     Pulses: Normal pulses.     Heart sounds: Normal heart sounds. No murmur heard.    No friction rub. No gallop.  Pulmonary:     Effort: Pulmonary effort is normal. No respiratory distress.     Breath sounds: Normal breath sounds. No stridor. No wheezing, rhonchi or rales.  Chest:     Chest wall: No tenderness.  Abdominal:     General: Bowel sounds are normal. There is no distension.     Palpations: Abdomen is soft. There is no hepatomegaly, splenomegaly or mass.     Tenderness: There is no abdominal tenderness. There is no right CVA tenderness, left CVA tenderness, guarding or rebound.     Hernia: No hernia is present.  Musculoskeletal:        General: No swelling, tenderness, deformity or signs of injury. Normal range of motion.     Cervical back: Normal range of motion and neck supple. No rigidity or tenderness.     Right lower leg: No edema.     Left lower leg: No edema.  Lymphadenopathy:     Cervical: No cervical adenopathy.  Skin:    General: Skin is warm and dry.     Coloration: Skin is not jaundiced or pale.     Findings: No bruising, erythema, lesion or rash.  Neurological:     General: No focal deficit present.     Mental Status: She is alert and oriented to person, place, and time. Mental status is at baseline.     Cranial Nerves: No cranial nerve deficit.     Sensory: No sensory deficit.     Motor: No weakness.     Coordination: Coordination normal.     Gait: Gait normal.     Deep Tendon Reflexes: Reflexes normal.  Psychiatric:        Mood and Affect: Mood normal.        Behavior: Behavior normal.        Thought Content: Thought content normal.        Judgment: Judgment normal.    LABS:      Latest Ref Rng & Units 01/18/2023   10:13 AM 06/14/2022    3:01 PM 01/23/2022    1:03 PM  CBC  WBC 4.0 - 10.5 K/uL 6.1  6.9  7.5    Hemoglobin 12.0 - 15.0 g/dL 57.8  46.9  62.9   Hematocrit 36.0 - 46.0 % 43.1  43.0  43.0   Platelets 150 - 400 K/uL 264  269  279       Latest  Ref Rng & Units 01/18/2023   10:13 AM 06/14/2022    3:01 PM 01/23/2022    1:03 PM  CMP  Glucose 70 - 99 mg/dL 90  94  96   BUN 8 - 23 mg/dL 8  8  10    Creatinine 0.44 - 1.00 mg/dL 1.61  0.96  0.45   Sodium 135 - 145 mmol/L 141  138  139   Potassium 3.5 - 5.1 mmol/L 3.5  3.4  4.0   Chloride 98 - 111 mmol/L 106  105  106   CO2 22 - 32 mmol/L 26  24  25    Calcium 8.9 - 10.3 mg/dL 9.5  9.1  9.0   Total Protein 6.5 - 8.1 g/dL 6.9  7.1  7.0   Total Bilirubin 0.3 - 1.2 mg/dL 0.5  0.4  0.4   Alkaline Phos 38 - 126 U/L 69  60  54   AST 15 - 41 U/L 19  18  17    ALT 0 - 44 U/L 12  11  13     Lab Results  Component Value Date   CEA1 2.1 01/18/2023   CEA 1.6 06/25/2019   /  CEA  Date Value Ref Range Status  01/18/2023 2.1 0.0 - 4.7 ng/mL Final    Comment:    (NOTE)                             Nonsmokers          <3.9                             Smokers             <5.6 [employer] Electrochemiluminescence Immunoassay (ECLIA) Values obtained with different assay methods or kits cannot be used interchangeably.  Results cannot be interpreted as absolute evidence of the presence or absence of malignant disease. Performed At: [facility] [address] [place], [place] [id number] Pearlean Botts MD NW:[id number]   06/25/2019 1.6  Final   Lab Results  Component Value Date   TSH 13.813 (H) 01/18/2023   STUDIES:  EXAM: 05/07/2023 DUAL X-RAY ABSORPTIOMETRY (DXA) FOR BONE MINERAL DENSITY Impression: AP Spine L1-L4 (L2) 05/07/2023 87.1 Low Bone Mass -1.6 0.977 g/cm2   Left Femur Total 05/07/2023 87.1 Osteoporosis -2.7 0.666 g/cm2   Right Forearm Radius 33% 05/07/2023 87.1 Osteoporosis -3.2 0.597 g/cm2    Allergies:  Allergies  Allergen Reactions   Other Anaphylaxis    Hornet sting    Current Medications: Current  Outpatient Medications  Medication Sig Dispense Refill   Cholecalciferol (VITAMIN D3) 250 MCG (10000 UT) capsule Take 10,000 Units by mouth daily.     clopidogrel  (PLAVIX ) 75 MG tablet Take 0.5 tablets (37.5 mg total) by mouth daily. 45 tablet 2   EPINEPHrine 0.3 mg/0.3 mL IJ SOAJ injection Inject into the muscle.     levothyroxine  (SYNTHROID ) 112 MCG tablet Take 112 mcg by mouth daily.     No current facility-administered medications for this visit.     ASSESSMENT & PLAN:  Assessment:   1. Stage IIIB colon cancer diagnosed in November 2019.  She was treated with surgery and adjuvant capecitabine.  She remains without evidence of recurrence.  2. Osteoporosis with history of right hip fracture, left pubic fracture and multiple fractures of her foot.  The patient was started on Reclast  in late August 2022, and  had her second dose on August 25.  The patient started calcium and vitamin-D and will continue it at least daily. She is due for repeat bone density scan so I will get that scheduled.  3. Hypothyroidism but her TSH was in the normal range in August 2022. I will repeat today.   Plan: She has been taking imodium once a day for the past 2-3 weeks which is improving her bowel movements. I suggested that she consume some bananas as they are a great source of potassium and are constipating. She informed me that she has been having itchy breakouts recently. Her last bone density scan from 2021 showed progression from Osteopenia to Osteoporosis. I will schedule a new bone density scan. She will receive a Reclast  injection next week. Her labs today are pending and I will add a TSH to that. I recommended that she take a oral calcium supplement 600mg  with vitamin D  1-2 daily. I will see her back in 6 months with CBC, CMP, TSH, and CEA. The patient understands the plans discussed today and is in agreement with them.  She knows to contact our office if she develops concerns prior to her next  appointment.   I provided 23 minutes of face-to-face time during this this encounter and > 50% was spent counseling as documented under my assessment and plan.    Nolia Baumgartner, MD [facility] [address] [place] [address] Dept: [phone] Dept Fax: [phone]   No orders of the defined types were placed in this encounter.   I,Jasmine M Lassiter,acting as a scribe for Nolia Baumgartner, MD.,have documented all relevant documentation on the behalf of Nolia Baumgartner, MD,as directed by  Nolia Baumgartner, MD while in the presence of Nolia Baumgartner, MD.

## 2023-07-25 ENCOUNTER — Inpatient Hospital Stay: Payer: Medicare Other

## 2023-07-25 ENCOUNTER — Inpatient Hospital Stay: Payer: Medicare Other | Admitting: Oncology

## 2023-08-02 ENCOUNTER — Inpatient Hospital Stay

## 2023-08-02 ENCOUNTER — Other Ambulatory Visit: Payer: Self-pay

## 2023-08-02 ENCOUNTER — Inpatient Hospital Stay: Admitting: Oncology

## 2023-08-02 ENCOUNTER — Other Ambulatory Visit: Payer: Self-pay | Admitting: Oncology

## 2023-08-02 DIAGNOSIS — C182 Malignant neoplasm of ascending colon: Secondary | ICD-10-CM

## 2023-08-08 NOTE — Progress Notes (Addendum)
 ADDENDUM:She had a bone density scan in February 2025 which revealed a T-score of -3.2 of the right forearm, consistent with osteoporosis I did not have a prior for comparison of the forearm.  The T-score of the spine was -1.6, improved from -1.8 in April 2021.  The T-score of her left femur was slightly worse at -2.7 as compared to -2.5 in April 2021.  I will recommend that she continue Reclast  and that will be due after January 25, 2024.     [facility]  [address]  [place],  [place]  [address] [phone]  Clinic Day: 08/09/23  Referring physician: Practice, Desiree Florence*   CHIEF COMPLAINT:  CC: Stage IIIB colon cancer, osteoporosis  Current Treatment:  Reclast  yearly  HISTORY OF PRESENT ILLNESS:  Carlisa Eble is a 89 y.o. female with stage IIIB (T3 N1b M0) colon cancer diagnosed in November 2019.  She had never had a colonoscopy before, but had unintentional weight loss of 35 lb over the last year with decreased appetite.  A stool Hemoccult was positive.  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  The right colon revealed a sessile serrated polyp with an area of low-grade dysplasia as well as a tubular adenoma and small fragments of adenocarcinoma.  The left colon also revealed tubular adenomas and a benign hyperplastic polyp.  Small bowel biopsies were benign.  CT scan showed a few tiny subcentimeter lesions in the left hepatic lobe of low-attenuation which appear most consistent with cysts.  There were multiple multiple small gallstones and a small benign appearing left renal cyst.  However, she had an annular soft tissue mass of the ascending colon with several subcentimeter pericolonic nodes up to 9 mm in diameter which were suspicious for metastatic disease.  She was referred to Dr. Jonita Neth and underwent laparoscopic right hemicolectomy and primary ileocolostomy, as well as a cholecystectomy, in December 2019.   The final pathology revealed 2 separate tumors, 1 measuring 9.5 cm and the other 0.4 cm.  This was a moderately differentiated invasive colorectal adenocarcinoma with mucinous features.  It extended into the pericolonic connective tissue and the smaller tumor extended into the muscularis propria.  There were some signet ring features and 3/35 nodes were positive for metastasis. There was lymphovascular invasion.  Margins were clear.  The gallbladder revealed cholelithiasis and chronic cholecystitis.  Postop CEA was 8.3, but it was normal by January 2020.  I recommended adjuvant chemotherapy.  She wanted to take oral chemotherapy, so was placed on capecitabine 1000 mg twice daily in February 2020.  Due to toxicities, we eventually lowered her dose to 1000 mg in the morning and 500 mg in the afternoon.  The iron pills made her diarrhea worse, so that was discontinued.  She completed 6 cycles of capecitabine in June 2020.  She underwent colonoscopy to ileocolic anastomosis in December 2020 with Dr. Jonita Neth. which was negative.  She fell and fractured her right hip at the end of January 2021, and underwent surgery with Dr. Rolinda Climes on February 1st, 2021.  We saw her for routine follow-up in April and recommended a bone density scan.  This revealed osteoporosis with a T-score of -2 5 in the left femur, as well as a T-score -1.8 the spine.  We discussed treatment options with the patient via telephone and she opted for yearly IV Reclast , which we had planned to give her at her routine follow-up in July.  She was instructed to start calcium and vitamin-D as well.  She presented to emergency department due to another fall and CT imaging there was a minimally displaced fracture involving the left pubic body, extending into the superior ramus, with an additional fracture of the left pubic root as well, extending into the superior pubic ramus.  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  INTERVAL HISTORY:  L[place]ia is here  for routine follow up for stage IIIB colon cancer. Patient states that she feels well but complains of mild back and hip pain and weight loss.  She had a bone density scan in February 2025 which revealed a T-score of -3.2 of the right forearm, consistent with osteoporosis I did not have a prior for comparison of the forearm.  The T-score of the spine was -1.6, improved from -1.8 in April 2021.  The T-score of her left femur was slightly worse at -2.7 as compared to -2.5 in April 2021.  I will recommend that she continue Reclast  and that will be due after November 7 of this year.  She was found to be hypothyroid and is now on medication. She is now drinking protein supplements and was placed on Remeron 15 mg daily and states that this seems to be helping. Her friend informed me that when she went to her PCP, her thyroid  levels were abnormal and there was a miscommunication. The patient believed she was discontinued off of her thyroid  medication but it was actually a dose change so she has not taken it for about 8 consecutive days. She now takes Synthroid  100 mcg once daily. Her last TSH was 13.813 in October, 2024. She has a WBC of 8.0, hemoglobin of 12.9, and platelet count of 233,000. Her CMP is completely normal. Her TSH and T4 levels are pending. I will see her back in 6 months with CBC, CMP, CEA, TSH, and T4.   She denies fever, chills, night sweats, or other signs of infection. She denies cardiorespiratory and gastrointestinal issues. She  denies pain. Her appetite is improved and Her weight has decreased 2 pounds over last 6 months. She is accompanied by her niece Ira Mann at today's visit.    REVIEW OF SYSTEMS:  Review of Systems  Constitutional:  Positive for appetite change (mildly improved) and fatigue. Negative for chills, diaphoresis, fever and unexpected weight change.       Cold intolerance  HENT:  Negative.  Negative for hearing loss, lump/mass, mouth sores, nosebleeds, sore throat, tinnitus,  trouble swallowing and voice change.   Eyes: Negative.  Negative for eye problems and icterus.  Respiratory: Negative.  Negative for chest tightness, cough, hemoptysis, shortness of breath and wheezing.   Cardiovascular: Negative.  Negative for chest pain, leg swelling and palpitations.  Gastrointestinal:  Positive for diarrhea. Negative for abdominal distention, abdominal pain, blood in stool, constipation, nausea, rectal pain and vomiting.       Frequent bowel movements, chronic since surgery  Endocrine: Negative.   Genitourinary: Negative.  Negative for bladder incontinence, difficulty urinating, dyspareunia, dysuria, frequency, hematuria, menstrual problem, nocturia, pelvic pain, vaginal bleeding and vaginal discharge.   Musculoskeletal:  Positive for back pain (mild). Negative for arthralgias, flank pain, gait problem, myalgias, neck pain and neck stiffness.       Left hip pain  Skin: Negative.  Negative for itching, rash and wound.  Neurological: Negative.  Negative for dizziness, extremity weakness, gait problem, headaches, light-headedness, numbness, seizures and speech difficulty.  Hematological: Negative.  Negative  for adenopathy. Does not bruise/bleed easily.  Psychiatric/Behavioral:  Positive for confusion. Negative for decreased concentration, depression, sleep disturbance and suicidal ideas. The patient is not nervous/anxious.     VITALS:  Blood pressure (!) 148/65, pulse 66, temperature 97.9 F (36.6 C), temperature source Oral, resp. rate 18, height 5\' 5"  (1.651 m), weight 107 lb 8 oz (48.8 kg), SpO2 100%.  Wt Readings from Last 3 Encounters:  08/09/23 107 lb 8 oz (48.8 kg)  07/18/23 106 lb (48.1 kg)  03/08/23 109 lb (49.4 kg)  Body mass index is 17.89 kg/m.  Performance status (ECOG): 1 - Symptomatic but completely ambulatory  PHYSICAL EXAM:  Physical Exam Vitals and nursing note reviewed. Exam conducted with a chaperone present.  Constitutional:      General: She is  not in acute distress.    Appearance: Normal appearance. She is normal weight. She is not ill-appearing, toxic-appearing or diaphoretic.  HENT:     Head: Normocephalic and atraumatic.     Right Ear: Tympanic membrane, ear canal and external ear normal. There is no impacted cerumen.     Left Ear: Tympanic membrane, ear canal and external ear normal. There is no impacted cerumen.     Nose: Nose normal. No congestion or rhinorrhea.     Mouth/Throat:     Mouth: Mucous membranes are moist.     Pharynx: Oropharynx is clear. No oropharyngeal exudate or posterior oropharyngeal erythema.  Eyes:     General: No scleral icterus.       Right eye: No discharge.        Left eye: No discharge.     Extraocular Movements: Extraocular movements intact.     Conjunctiva/sclera: Conjunctivae normal.     Pupils: Pupils are equal, round, and reactive to light.  Neck:     Vascular: No carotid bruit.  Cardiovascular:     Rate and Rhythm: Normal rate and regular rhythm.     Pulses: Normal pulses.     Heart sounds: Normal heart sounds. No murmur heard.    No friction rub. No gallop.  Pulmonary:     Effort: Pulmonary effort is normal. No respiratory distress.     Breath sounds: Normal breath sounds. No stridor. No wheezing, rhonchi or rales.  Chest:     Chest wall: No tenderness.  Abdominal:     General: Bowel sounds are normal. There is no distension.     Palpations: Abdomen is soft. There is no hepatomegaly, splenomegaly or mass.     Tenderness: There is no abdominal tenderness. There is no right CVA tenderness, left CVA tenderness, guarding or rebound.     Hernia: No hernia is present.  Musculoskeletal:        General: No swelling, tenderness, deformity or signs of injury. Normal range of motion.     Cervical back: Normal range of motion and neck supple. No rigidity or tenderness.     Right lower leg: No edema.     Left lower leg: No edema.  Lymphadenopathy:     Cervical: No cervical adenopathy.   Skin:    General: Skin is warm and dry.     Coloration: Skin is not jaundiced or pale.     Findings: No bruising, erythema, lesion or rash.  Neurological:     General: No focal deficit present.     Mental Status: She is alert and oriented to person, place, and time. Mental status is at baseline.     Cranial Nerves: No cranial nerve deficit.  Sensory: No sensory deficit.     Motor: No weakness.     Coordination: Coordination normal.     Gait: Gait normal.     Deep Tendon Reflexes: Reflexes normal.  Psychiatric:        Mood and Affect: Mood normal.        Behavior: Behavior normal.        Thought Content: Thought content normal.        Judgment: Judgment normal.    LABS:      Latest Ref Rng & Units 08/09/2023    3:01 PM 01/18/2023   10:13 AM 06/14/2022    3:01 PM  CBC  WBC 4.0 - 10.5 K/uL 8.0  6.1  6.9   Hemoglobin 12.0 - 15.0 g/dL 16.1  09.6  04.5   Hematocrit 36.0 - 46.0 % 40.5  43.1  43.0   Platelets 150 - 400 K/uL 233  264  269       Latest Ref Rng & Units 08/09/2023    3:01 PM 01/18/2023   10:13 AM 06/14/2022    3:01 PM  CMP  Glucose 70 - 99 mg/dL 96  90  94   BUN 8 - 23 mg/dL 10  8  8    Creatinine 0.44 - 1.00 mg/dL 4.09  8.11  9.14   Sodium 135 - 145 mmol/L 140  141  138   Potassium 3.5 - 5.1 mmol/L 4.2  3.5  3.4   Chloride 98 - 111 mmol/L 107  106  105   CO2 22 - 32 mmol/L 22  26  24    Calcium 8.9 - 10.3 mg/dL 9.7  9.5  9.1   Total Protein 6.5 - 8.1 g/dL 6.8  6.9  7.1   Total Bilirubin 0.0 - 1.2 mg/dL 0.3  0.5  0.4   Alkaline Phos 38 - 126 U/L 81  69  60   AST 15 - 41 U/L 35  19  18   ALT 0 - 44 U/L 25  12  11     Lab Results  Component Value Date   TSH 0.227 (L) 08/09/2023   Lab Results  Component Value Date   CEA1 2.1 01/18/2023   CEA 2.63 08/09/2023   /  CEA  Date Value Ref Range Status  01/18/2023 2.1 0.0 - 4.7 ng/mL Final    Comment:    (NOTE)                             Nonsmokers          <3.9                             Smokers              <5.6 [employer] Electrochemiluminescence Immunoassay (ECLIA) Values obtained with different assay methods or kits cannot be used interchangeably.  Results cannot be interpreted as absolute evidence of the presence or absence of malignant disease. Performed At: [facility] [address] [place], [place] [id number] Pearlean Botts MD YQ:[id number]    CEA ([facility])  Date Value Ref Range Status  08/09/2023 2.63 0.00 - 5.00 ng/mL Final    Comment:    (NOTE) This test was performed using Beck[employer] Coulter's paramagnetic chemiluminescent immunoassay. Values obtained from different assay methods cannot be used interchangeably. Please note that up to 8% of patients who smoke may see values 5.1-10.0 ng/ml  and 1% of patients who smoke may see CEA levels >10.0 ng/ml. Performed at [facility], [address], [place], [place] [address]      STUDIES:  EXAM: 05/07/2023 DUAL X-RAY ABSORPTIOMETRY (DXA) FOR BONE MINERAL DENSITY IMPRESSION: AP Spine L1-L4 (L2) 05/07/2023 87.1 Low Bone Mass -1.6 0.977 g/cm2 Left Femur Total 05/07/2023 87.1 Osteoporosis -2.7 0.666 g/cm2 Right Forearm Radius 33% 05/07/2023 87.1 Osteoporosis -3.2 0.597 g/cm2  Allergies:  Allergies  Allergen Reactions   Other Anaphylaxis    Hornet sting    Current Medications: Current Outpatient Medications  Medication Sig Dispense Refill   levothyroxine  (SYNTHROID ) 100 MCG tablet Take 100 mcg by mouth daily.     mirtazapine (REMERON) 15 MG tablet Take 15 mg by mouth daily.     Cholecalciferol (VITAMIN D3) 250 MCG (10000 UT) capsule Take 10,000 Units by mouth daily.     clopidogrel  (PLAVIX ) 75 MG tablet Take 0.5 tablets (37.5 mg total) by mouth daily. 45 tablet 2   EPINEPHrine 0.3 mg/0.3 mL IJ SOAJ injection Inject into the muscle.     No current facility-administered medications for this visit.   ASSESSMENT & PLAN:  Assessment:   1. Stage IIIB colon cancer diagnosed in November  2019.  She was treated with surgery and adjuvant capecitabine.  She remains without evidence of recurrence.  2. Osteoporosis with history of right hip fracture, left pubic fracture and multiple fractures of her foot.  The patient was started on Reclast  in late August 2022, and had her second dose on August 25.  The patient started calcium and vitamin-D and will continue it at least daily. She had a bone density scan in February 2025 which revealed a T-score of -3.2 of the right forearm, consistent with osteoporosis I did not have a prior for comparison of the forearm.  The T-score of the spine was -1.6, improved from -1.8 in April 2021.  The T-score of her left femur was slightly worse at -2.7 as compared to -2.5 in April 2021.  I will recommend that she continue Reclast  and that will be due after November 7 of this year.  3. Hypothyroidism but her TSH was in the was 13.813 in October, 2024 and she is now on 100 mcg daily. I will repeat today.   Plan: She was found to be hypothyroid and is now on medication. She had a bone density scan in February 2025 which revealed a T-score of -3.2 of the right forearm, consistent with osteoporosis I did not have a prior for comparison of the forearm.  The T-score of the spine was -1.6, improved from -1.8 in April 2021.  The T-score of her left femur was slightly worse at -2.7 as compared to -2.5 in April 2021.  I will recommend that she continue Reclast  and that will be due after November 7 of this year.  She is now drinking protein supplements and was placed on Remeron 15 mg daily and states that this seems to be helping. Her friend informed me that when she went to her PCP her thyroid  levels were abnormal and there was a miscommunication. The patient believed she was discontinued off of her thyroid  medication but it was actually a dose change so she has not taken it for about 8 consecutive days. She now takes Synthroid  100 mcg once daily. Her last TSH was 13.813 in  October, 2024. She has a WBC of 8.0, hemoglobin of 12.9, and platelet count of 233,000. Her CMP is completely normal. Her TSH  and T4 levels are pending. I will see her back in 6 months with CBC, CMP, CEA, TSH, and T4. The patient understands the plans discussed today and is in agreement with them.  She knows to contact our office if she develops concerns prior to her next appointment.  I provided 17 minutes of face-to-face time during this this encounter and > 50% was spent counseling as documented under my assessment and plan.   Nolia Baumgartner, MD  [facility] OF [facility] [address] [place] [address] Dept: [phone] Dept Fax: [phone]   No orders of the defined types were placed in this encounter.  I,Jasmine M Lassiter,acting as a scribe for Nolia Baumgartner, MD.,have documented all relevant documentation on the behalf of Nolia Baumgartner, MD,as directed by  Nolia Baumgartner, MD while in the presence of Nolia Baumgartner, MD.

## 2023-08-09 ENCOUNTER — Encounter: Payer: Self-pay | Admitting: Oncology

## 2023-08-09 ENCOUNTER — Other Ambulatory Visit: Payer: Self-pay | Admitting: Oncology

## 2023-08-09 ENCOUNTER — Inpatient Hospital Stay: Admitting: Oncology

## 2023-08-09 ENCOUNTER — Inpatient Hospital Stay: Attending: Oncology

## 2023-08-09 VITALS — BP 148/65 | HR 66 | Temp 97.9°F | Resp 18 | Ht 65.0 in | Wt 107.5 lb

## 2023-08-09 DIAGNOSIS — C182 Malignant neoplasm of ascending colon: Secondary | ICD-10-CM

## 2023-08-09 DIAGNOSIS — M549 Dorsalgia, unspecified: Secondary | ICD-10-CM | POA: Insufficient documentation

## 2023-08-09 DIAGNOSIS — R634 Abnormal weight loss: Secondary | ICD-10-CM | POA: Diagnosis not present

## 2023-08-09 DIAGNOSIS — M810 Age-related osteoporosis without current pathological fracture: Secondary | ICD-10-CM | POA: Diagnosis not present

## 2023-08-09 DIAGNOSIS — E039 Hypothyroidism, unspecified: Secondary | ICD-10-CM | POA: Insufficient documentation

## 2023-08-09 DIAGNOSIS — Z85038 Personal history of other malignant neoplasm of large intestine: Secondary | ICD-10-CM | POA: Insufficient documentation

## 2023-08-09 DIAGNOSIS — M25559 Pain in unspecified hip: Secondary | ICD-10-CM | POA: Insufficient documentation

## 2023-08-09 LAB — CBC WITH DIFFERENTIAL (CANCER CENTER ONLY)
Abs Immature Granulocytes: 0.06 10*3/uL (ref 0.00–0.07)
Basophils Absolute: 0.1 10*3/uL (ref 0.0–0.1)
Basophils Relative: 1 %
Eosinophils Absolute: 0.2 10*3/uL (ref 0.0–0.5)
Eosinophils Relative: 2 %
HCT: 40.5 % (ref 36.0–46.0)
Hemoglobin: 12.9 g/dL (ref 12.0–15.0)
Immature Granulocytes: 1 %
Lymphocytes Relative: 27 %
Lymphs Abs: 2.1 10*3/uL (ref 0.7–4.0)
MCH: 29.5 pg (ref 26.0–34.0)
MCHC: 31.9 g/dL (ref 30.0–36.0)
MCV: 92.7 fL (ref 80.0–100.0)
Monocytes Absolute: 0.7 10*3/uL (ref 0.1–1.0)
Monocytes Relative: 9 %
Neutro Abs: 4.8 10*3/uL (ref 1.7–7.7)
Neutrophils Relative %: 60 %
Platelet Count: 233 10*3/uL (ref 150–400)
RBC: 4.37 MIL/uL (ref 3.87–5.11)
RDW: 13.6 % (ref 11.5–15.5)
WBC Count: 8 10*3/uL (ref 4.0–10.5)
nRBC: 0 % (ref 0.0–0.2)

## 2023-08-09 LAB — CMP (CANCER CENTER ONLY)
ALT: 25 U/L (ref 0–44)
AST: 35 U/L (ref 15–41)
Albumin: 4.1 g/dL (ref 3.5–5.0)
Alkaline Phosphatase: 81 U/L (ref 38–126)
Anion gap: 11 (ref 5–15)
BUN: 10 mg/dL (ref 8–23)
CO2: 22 mmol/L (ref 22–32)
Calcium: 9.7 mg/dL (ref 8.9–10.3)
Chloride: 107 mmol/L (ref 98–111)
Creatinine: 0.81 mg/dL (ref 0.44–1.00)
GFR, Estimated: >60 mL/min (ref >60)
Glucose, Bld: 96 mg/dL (ref 70–99)
Potassium: 4.2 mmol/L (ref 3.5–5.1)
Sodium: 140 mmol/L (ref 135–145)
Total Bilirubin: 0.3 mg/dL (ref 0.0–1.2)
Total Protein: 6.8 g/dL (ref 6.5–8.1)

## 2023-08-09 LAB — TSH: TSH: 0.227 u[IU]/mL — ABNORMAL LOW (ref 0.350–4.500)

## 2023-08-09 LAB — CEA (ACCESS): CEA (CHCC): 2.63 ng/mL (ref 0.00–5.00)

## 2023-08-10 ENCOUNTER — Telehealth: Payer: Self-pay | Admitting: Oncology

## 2023-08-10 NOTE — Telephone Encounter (Signed)
 Patient has been scheduled for follow-up visit per 08/08/23 LOS.  Pt aware of scheduled appt details.

## 2023-08-20 ENCOUNTER — Encounter: Payer: Self-pay | Admitting: Oncology

## 2023-10-16 NOTE — Progress Notes (Unsigned)
 [facility] [address] [place] [address] Phone: [phone] Subjective:   [name], am serving as a scribe for Dr. Arthea Claudene.  I'm seeing this patient by the request  of:  Orlando Dwayne NOVAK, FNP  CC: Right hip pain follow-up  YEP:Dlagzrupcz  [name] is a 89 y.o. female coming in with complaint of R hip pain. Injected last visit, 07/18/2023.  Is been seen and followed by oncology for her colon cancer.  Patient states that she is her      Past Medical History:  Diagnosis Date   Hypothyroidism    Osteoporosis with fracture 05/03/2019   S/P right hip fracture 05/02/2019   Past Surgical History:  Procedure Laterality Date   ABDOMINAL HYSTERECTOMY     w/ bilateral salpingo oophorectomy   APPENDECTOMY     CHOLECYSTECTOMY     COLON SURGERY  2020   right hemicolectomy   REPLACEMENT TOTAL KNEE BILATERAL     TONSILLECTOMY     Social History   Socioeconomic History   Marital status: Widowed    Spouse name: Not on file   Number of children: 0   Years of education: Not on file   Highest education level: Not on file  Occupational History   Not on file  Tobacco Use   Smoking status: Never   Smokeless tobacco: Never  Substance and Sexual Activity   Alcohol use: Never    Alcohol/week: 0.0 standard drinks of alcohol   Drug use: Never   Sexual activity: Not Currently  Other Topics Concern   Not on file  Social History Narrative   Not on file   Social Drivers of Health   Financial Resource Strain: Not on file  Food Insecurity: Not on file  Transportation Needs: Not on file  Physical Activity: Not on file  Stress: Not on file  Social Connections: Not on file   Allergies  Allergen Reactions   Other Anaphylaxis    Hornet sting   No family history on file.  Current Outpatient Medications (Endocrine & Metabolic):    levothyroxine  (SYNTHROID ) 100 MCG tablet, Take 100 mcg by mouth daily.  Current Outpatient  Medications (Cardiovascular):    EPINEPHrine 0.3 mg/0.3 mL IJ SOAJ injection, Inject into the muscle.    Current Outpatient Medications (Hematological):    clopidogrel  (PLAVIX ) 75 MG tablet, Take 0.5 tablets (37.5 mg total) by mouth daily.  Current Outpatient Medications (Other):    Cholecalciferol (VITAMIN D3) 250 MCG (10000 UT) capsule, Take 10,000 Units by mouth daily.   mirtazapine (REMERON) 15 MG tablet, Take 15 mg by mouth daily.   Reviewed prior external information including notes and imaging from  primary care provider As well as notes that were available from care everywhere and other healthcare systems.  Past medical history, social, surgical and family history all reviewed in electronic medical record.  No pertanent information unless stated regarding to the chief complaint.   Review of Systems:  No headache, visual changes, nausea, vomiting, diarrhea, constipation, dizziness, abdominal pain, skin rash, fevers, chills, night sweats, weight loss, swollen lymph nodes, body aches, joint swelling, chest pain, shortness of breath, mood changes. POSITIVE muscle aches  Objective  Blood pressure 118/78, pulse 84, height 5' 5 (1.651 m), weight 110 lb (49.9 kg), SpO2 90%.   General: No apparent distress alert not completely oriented HEENT: Pupils equal, extraocular movements intact  Respiratory: Patient's speak in full sentences and does not appear short of breath  Cardiovascular: Trace edema Right hip exam shows severe tenderness to palpation over the greater trochanteric area.  Postsurgical changes noted.   After verbal consent patient was prepped with alcohol swab and with a 21-gauge 2 inch needle injected into the right greater trochanteric area with 2 cc of 0.5% Marcaine and 1 cc of Kenalog  40 mg/mL.  No blood loss.  Band-Aid placed.  Postinjection instructions given    Impression and Recommendations:    The above documentation has been reviewed and is accurate and  complete Rachel Drew M Marlyne Totaro, DO

## 2023-10-17 ENCOUNTER — Encounter: Payer: Self-pay | Admitting: Family Medicine

## 2023-10-17 ENCOUNTER — Ambulatory Visit: Admitting: Family Medicine

## 2023-10-17 VITALS — BP 118/78 | HR 84 | Ht 65.0 in | Wt 110.0 lb

## 2023-10-17 DIAGNOSIS — M25551 Pain in right hip: Secondary | ICD-10-CM

## 2023-10-17 NOTE — Patient Instructions (Addendum)
 Injected hip today See me in 3 months

## 2023-10-17 NOTE — Assessment & Plan Note (Signed)
 Repeat given again.  Patient is becoming more demented and appears at baseline.  Has gained 3 pounds since last visit so encouraged her to continue to do so.  Follow-up with me again in 3 months otherwise for palliative improvement of the hip pain on the right side.

## 2024-01-17 ENCOUNTER — Ambulatory Visit: Admitting: Family Medicine

## 2024-02-06 ENCOUNTER — Inpatient Hospital Stay

## 2024-02-06 ENCOUNTER — Inpatient Hospital Stay: Admitting: Hematology and Oncology

## 2024-02-21 ENCOUNTER — Inpatient Hospital Stay: Admitting: Hematology and Oncology

## 2024-02-21 ENCOUNTER — Inpatient Hospital Stay

## 2024-03-20 DEATH — deceased

## 2024-04-02 ENCOUNTER — Ambulatory Visit: Admitting: Family Medicine

## 2024-04-30 ENCOUNTER — Inpatient Hospital Stay

## 2024-04-30 ENCOUNTER — Inpatient Hospital Stay: Admitting: Hematology and Oncology
